# Patient Record
Sex: Male | Born: 1955 | Race: Asian | Hispanic: No | Marital: Married | Smoking: Never smoker
Health system: Southern US, Community
[De-identification: ages and names within clinical notes are randomized; demographics above are authoritative.]

## PROBLEM LIST (undated history)

## (undated) DIAGNOSIS — F411 Generalized anxiety disorder: Secondary | ICD-10-CM

## (undated) DIAGNOSIS — I10 Essential (primary) hypertension: Secondary | ICD-10-CM

## (undated) DIAGNOSIS — I440 Atrioventricular block, first degree: Secondary | ICD-10-CM

## (undated) DIAGNOSIS — E119 Type 2 diabetes mellitus without complications: Secondary | ICD-10-CM

## (undated) DIAGNOSIS — E7800 Pure hypercholesterolemia, unspecified: Secondary | ICD-10-CM

## (undated) DIAGNOSIS — M25559 Pain in unspecified hip: Secondary | ICD-10-CM

## (undated) DIAGNOSIS — N529 Male erectile dysfunction, unspecified: Secondary | ICD-10-CM

## (undated) DIAGNOSIS — E785 Hyperlipidemia, unspecified: Secondary | ICD-10-CM

## (undated) DIAGNOSIS — M1990 Unspecified osteoarthritis, unspecified site: Secondary | ICD-10-CM

## (undated) DIAGNOSIS — L2089 Other atopic dermatitis: Secondary | ICD-10-CM

## (undated) DIAGNOSIS — K219 Gastro-esophageal reflux disease without esophagitis: Secondary | ICD-10-CM

## (undated) HISTORY — PX: PARTIAL HIP ARTHROPLASTY: SHX733

## (undated) HISTORY — DX: Hyperlipidemia, unspecified: E78.5

## (undated) HISTORY — DX: Atrioventricular block, first degree: I44.0

## (undated) HISTORY — DX: Unspecified osteoarthritis, unspecified site: M19.90

## (undated) HISTORY — DX: Type 2 diabetes mellitus without complications: E11.9

## (undated) HISTORY — DX: Other atopic dermatitis: L20.89

## (undated) HISTORY — DX: Generalized anxiety disorder: F41.1

## (undated) HISTORY — DX: Essential (primary) hypertension: I10

## (undated) HISTORY — DX: Male erectile dysfunction, unspecified: N52.9

## (undated) HISTORY — DX: Pain in unspecified hip: M25.559

## (undated) HISTORY — DX: Gastro-esophageal reflux disease without esophagitis: K21.9

## (undated) HISTORY — DX: Pure hypercholesterolemia, unspecified: E78.00

---

## 2004-05-21 ENCOUNTER — Encounter: Admission: RE | Admit: 2004-05-21 | Discharge: 2004-05-21 | Payer: Self-pay | Admitting: Orthopaedic Surgery

## 2007-09-28 ENCOUNTER — Inpatient Hospital Stay (HOSPITAL_COMMUNITY): Admission: RE | Admit: 2007-09-28 | Discharge: 2007-10-02 | Payer: Self-pay | Admitting: Orthopedic Surgery

## 2009-09-21 IMAGING — CR DG PORTABLE PELVIS
1 series · 1 of 1 positions shown · non-contrast
Comparison: 09/21/2007.

CLINICAL DATA: Status post right hip replacement for
osteoarthritis.

PORTABLE PELVIS

[view not recorded]
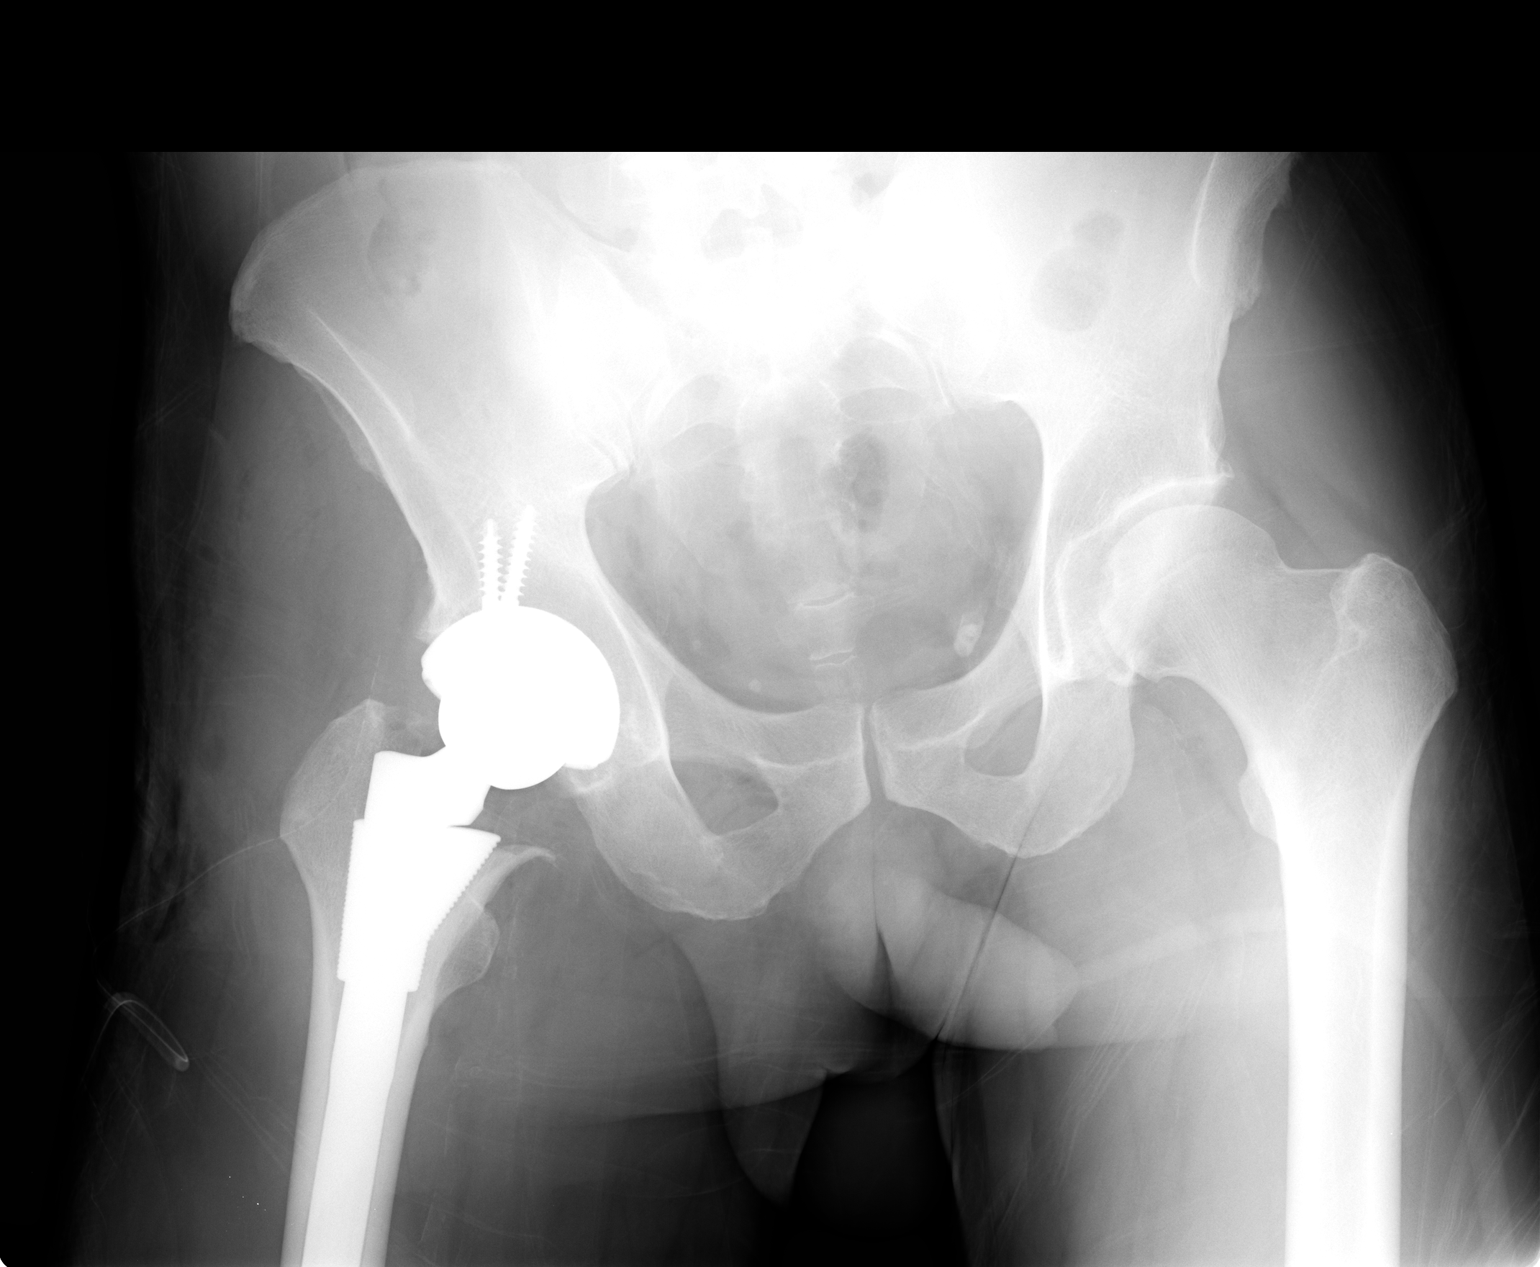

[1 of 1 positions shown; findings below may reference images not displayed]

FINDINGS: Interval right total hip prosthesis in satisfactory
position and alignment.  The distal portion of the femoral
component is not included and is evaluated on a separate hip
radiograph.  No fracture or dislocation seen.
IMPRESSION: Satisfactory postoperative appearance of a right total hip
prosthesis.

## 2009-09-24 IMAGING — CR DG CHEST 2V
2 series · 2 of 2 positions shown · non-contrast
Comparison: PA and lateral chest radiograph 09/21/2007

CLINICAL DATA: Osteoarthritis right hip.   Evaluate for atelectasis
or effusions.

CHEST - 2 VIEW

[w chest pa]
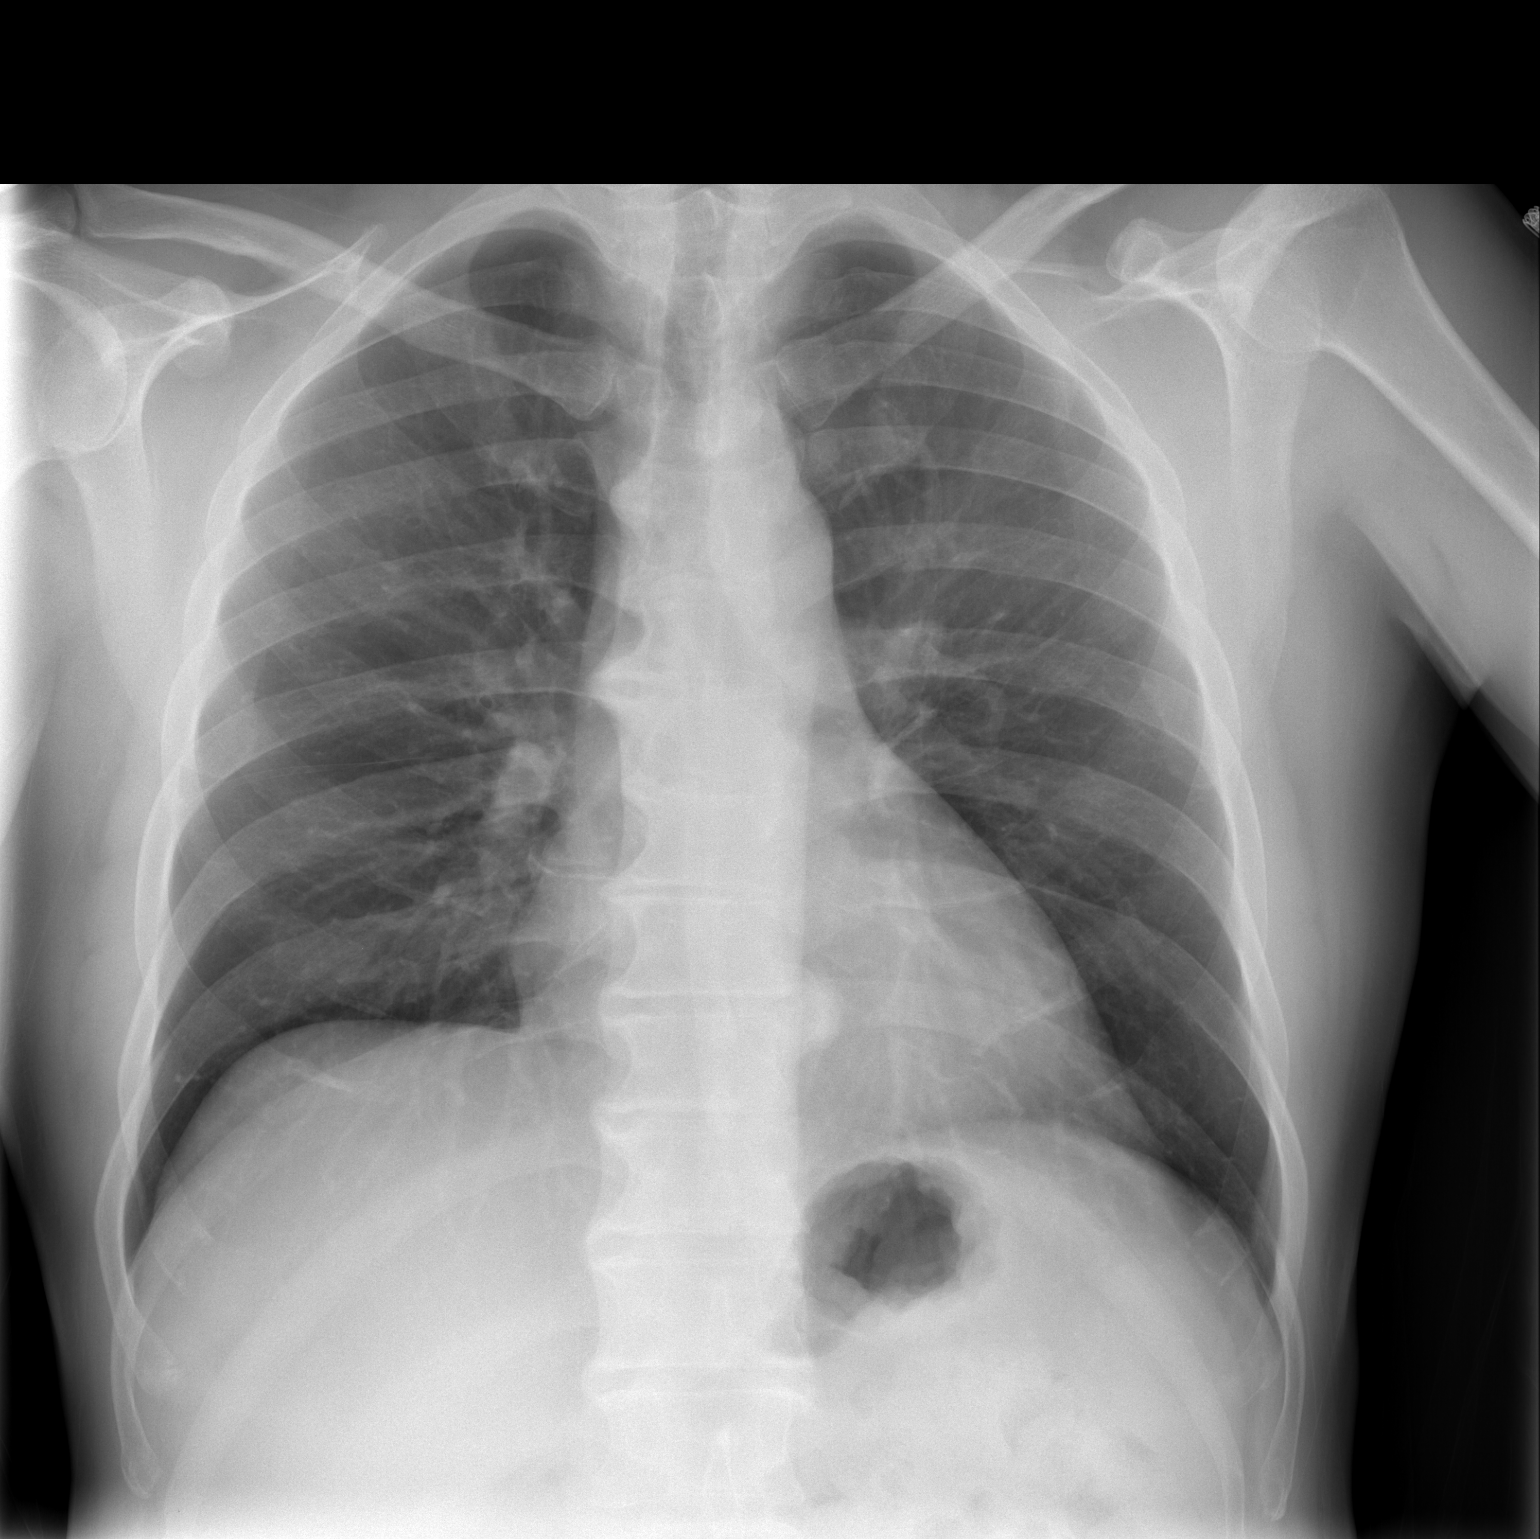

[w chest lat]
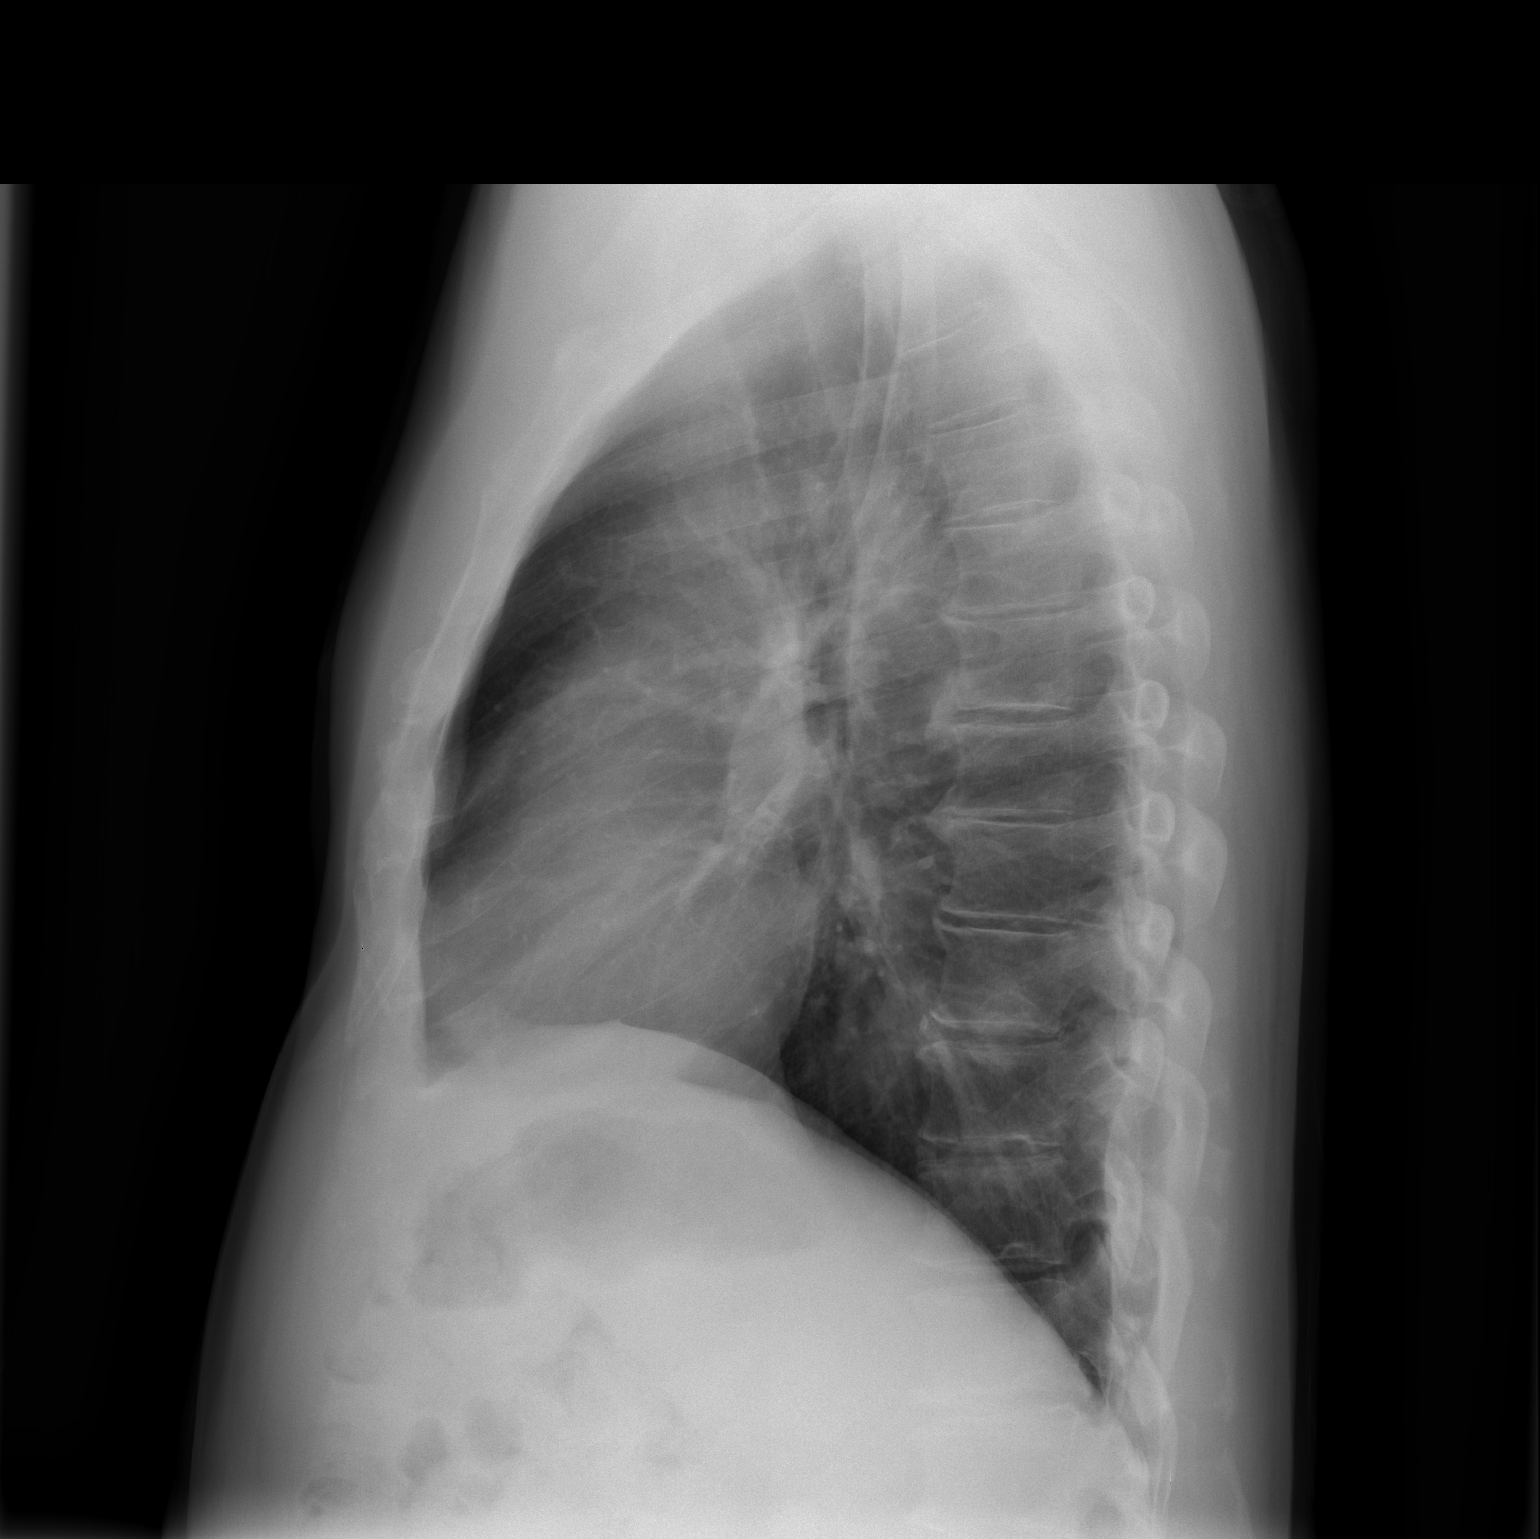

[2 of 2 positions shown; findings below may reference images not displayed]

FINDINGS: Heart and mediastinal contours are stable.  The lungs are
clear.  There is no pleural effusion.  Thoracic spine degenerative
changes are noted.
IMPRESSION: No evidence of acute cardiopulmonary disease.

## 2010-07-24 NOTE — Op Note (Signed)
[name], Livingston NO.:  [id number]   MEDICAL RECORD NO.:  [id number]          PATIENT TYPE:  INP   LOCATION:  0009                         FACILITY:  [facility]   PHYSICIAN:  Ollen Gross, M.D.    DATE OF BIRTH:  [birth year]   DATE OF PROCEDURE:  09/28/2007  DATE OF DISCHARGE:                               OPERATIVE REPORT   PREOPERATIVE DIAGNOSIS:  Osteoarthritis right hip.   POSTOPERATIVE DIAGNOSIS:  Osteoarthritis right hip.   PROCEDURE:  Right total hip arthroplasty.   SURGEON:  Ollen Gross, M.D.   ASSISTANT:  Alexzandrew L. Perkins, P.A.C.   ANESTHESIA:  General.   ESTIMATED BLOOD LOSS:  500.   DRAIN:  Hemovac x1.   COMPLICATIONS:  None.   CONDITION:  Stable to recovery.   BRIEF CLINICAL NOTE:  Greg Livingston is a 55 year old male with end-stage  arthritis of the right hip with progressively worsening pain and  dysfunction.  He has failed nonoperative management and presents now for  total hip arthroplasty.   PROCEDURE IN DETAIL:  After the successful administration of general  anesthetic, the patient was placed in left lateral decubitus position  with the right side up and held with the hip positioner.  The right  lower extremity was isolated from its perineum with plastic drapes and  prepped and draped in the usual sterile fashion.  A short posterolateral  incision was made with a 10 blade through the subcutaneous tissue to the  level of fascia lata which was incised in line with the skin incision.  The sciatic nerve was palpated and protected, and the short external  rotators isolated off the femur.  Capsulectomy was performed and the hip  was dislocated.  The center of the femoral head was marked and a trial  prosthesis placed such that the center of the trial head corresponded to  the center of his native femoral head.  Osteotomy line was marked at the  femoral neck and osteotomy made with an oscillating saw.  The femoral  head then  removed with the femur retracted anteriorly to gain acetabular  exposure.   Acetabular retractors were placed and the labrum and osteophytes  removed.  Acetabular reaming starts at 47 mm increasing in increments of  2mm to 53 mm and then a 54 mm pinnacle acetabular shell was placed in an  anatomic position and transfixed with two dome screws.  The apex hole  eliminator was placed and the 36 mm neutral Ultramet metal pole liner  was placed for a metal-on-metal hip replacement.   The femur is prepared with the canal finder and irrigation.  Axial  reaming was performed to 15.5 mm proximally with a20D and machined to  large.  A 20B large trial sleeve was placed with a 25-15 stem and a 36  standard neck, matching native anteversion.  A 36+0 head was place.  The  hip was reduced and stable to 70 degrees flexion,40 degrees abduction,  90 degrees internal rotation, 90 degrees of flexion, and 70 degrees of  internal rotation.  The hip was then dislocated and the trials removed.  The permanent 20D large  sleeve was placed with a 25-15 stem,  36  standard neck, matching native anteversion.  A 36+0 head was placed and  had good tension and the same stability parameters.  The wound was  copiously irrigated with saline solution and the short rotators were  reattached to the femur through drill holes.  The fascia lata was closed  over a Hemovac drain with interrupted #1 Vicryl.  The subcu closed with  a 1-0 and 2-0 Vicryl and subcuticular running 4-0 Monocryl.  The  incision was cleaned and dried and Steri-Strips and a bulky sterile  dressing applied.  A drain was hooked to suction and he was placed into  a knee immobilizer, awakened and transported to recovery in stable  condition.      Ollen Gross, M.D.  Electronically Signed     FA/MEDQ  D:  09/28/2007  T:  09/28/2007  Job:  [id number]

## 2010-07-27 NOTE — H&P (Signed)
Greg Livingston, BRAME NO.:  [id number]   MEDICAL RECORD NO.:  [id number]          PATIENT TYPE:  INP   LOCATION:  1602                         FACILITY:  [facility]   PHYSICIAN:  Ollen Gross, M.D.    DATE OF BIRTH:  1955/08/25   DATE OF ADMISSION:  09/28/2007  DATE OF DISCHARGE:  10/02/2007                              HISTORY & PHYSICAL   CHIEF COMPLAINT:  Right hip pain.   HISTORY OF PRESENT ILLNESS:  Patient is a 55 year old male who has been  seen by Dr. Lequita Halt for ongoing hip pain.  It has been ongoing for quite  some time now.  He has been treated conservatively in the past but has  increasing pain despite conservative measures.  X-rays show  significantly concentric joint space narrowing with right hip and  osteophyte formation.  It is felt he has reached the point where he  could benefit from undergoing surgical intervention.  Risks and benefits  discussed.  The patient is subsequently admitted to the hospital.   ALLERGIES:  None.   CURRENT MEDICATIONS:  Lisinopril, metformin.   PAST MEDICAL HISTORY:  1. Hypertension.  2. Diabetes.   PAST SURGERY HISTORY:  None.   FAMILY HISTORY:  Father with cancer.  Mother with diabetes.   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  Alcohol, about 3  ounces a day.  One child.   REVIEW OF SYSTEMS:  GENERAL:  No fevers, chills, night sweats.  Seizures, syncope or paralysis.  Respiratory: No shortness of breath,  productive cough or hemoptysis.  CARDIOVASCULAR:  Chest pain, angina or  orthopnea.  GI:  No nausea, vomiting, diarrhea or constipation.  GU: No  dysuria, hematuria or discharge.  MUSCULOSKELETAL: Right hip.   PHYSICAL EXAMINATION:  VITAL SIGNS:  Pulse 64, respirations 12, blood  pressure 152/72.  GENERAL:  A 51-year white male, thin frame, no acute distress, alert,  oriented and cooperative.  HEENT: Normocephalic, atraumatic.  Pupils round and reactive.  Oropharynx clear.  EOMs intact.  NECK:   Supple.  CHEST:  Clear.  HEART:  Regular rate and rhythm.  No murmur, S1-S2 noted.  ABDOMEN:  Soft, nontender.  Bowel sounds present.  RECTAL, BREASTS, GENITALIA:  Not done, not pertinent to present illness.  EXTREMITIES:  Right hip flexion 100, 5 degrees internal rotation, 15  degrees external rotation, about 25 degrees abduction, slight antalgic  gait.   IMPRESSION:  Osteoarthritis right hip.   PLAN:  The patient admitted to [facility] to undergo a right  total hip.  Surgery will be performed by Ollen Gross.      Alexzandrew L. Perkins, P.A.C.      Ollen Gross, M.D.  Electronically Signed    ALP/MEDQ  D:  10/04/2007  T:  10/04/2007  Job:  [id number]   cc:   Dr. Tiburcio Pea

## 2010-07-27 NOTE — Discharge Summary (Signed)
NAMEDEVIAN, BARTOLOMEI NO.:  [id number]   MEDICAL RECORD NO.:  [id number]          PATIENT TYPE:  INP   LOCATION:  1602                         FACILITY:  [facility]   PHYSICIAN:  Ollen Gross, M.D.    DATE OF BIRTH:  1955/06/12   DATE OF ADMISSION:  09/28/2007  DATE OF DISCHARGE:  10/02/2007                               DISCHARGE SUMMARY   ADMITTING DIAGNOSES:  1. Osteoarthritis right hip.  2. Hypertension.  3. Diabetes.   DISCHARGE DIAGNOSES:  1. Osteoarthritis right hip status post right total replacement      arthroplasty.  2. Mild postop blood loss anemia.  3. Mild postop hyponatremia.  4. Hypertension.  5. Diabetes.   PROCEDURE:  September 28, 2007 right total hip.  Surgeon, Dr. Lequita Halt.  Assistant, Avel Peace, PA-C.   ANESTHESIA:  General.   CONSULTS:  None.   BRIEF HISTORY:  Mr. Greg Livingston is a 55 year old male with end-stage  osteoarthritis right hip, progressive worsening pain dysfunction, failed  nonoperative management.  Now presents for total hip arthroplasty.   LABORATORY DATA:  Preop CBC, hemoglobin 14.1, hematocrit of 44.8, white  cell count 5.5, red cell count 7.06, platelets 174,000.  Postop  hemoglobin 11 dropped down to 10.6 last night, H&H 9.9 and 31.4.  PT/PTT  preop 13.4 and 27 respectively with an INR 1.  Serial pro time followed.  PT/INR 21.9 and 1.8.  Chem panel on admission all within normal limits  with exception of minimally elevated total bili of 1.5 and elevated  glucose 286.  Known diabetic.  Serial BMET followed.  Sodium did drop  from 139 to 133 back up to 137, glucose came down to 208.  Preop UA  positive glucose otherwise negative.  Followup UA positive glucose  otherwise negative.  Blood group type A positive.  Urine culture did  prove to be positive for enterobacter, sensitivities on chart.  He did  receive IV antibiotics postoperatively.   EKG September 21, 2007 sinus rhythm, first degree AV block, incomplete right  bundle  branch block, borderline EKG confirmed by Dr. Armanda Magic.  Hip  films September 21, 2007 advanced degenerate changes involving the right hip.  Chest films September 21, 2007 no acute findings.  Followup chest on October 01, 2007.  No evidence acute findings.   HOSPITAL COURSE:  The patient admitted to [facility] tolerated  procedure well, later transferred recovery and then orthopedic floor  started on PCA and p.o. analgesics doing pretty well on the morning of  day one, got a little dizzy that evening.  Early morning he started back  on his blood pressure meds.  He was on lisinopril which was on hold  initially.  His blood pressures running a little on the lower side but  he was asymptomatic with that.  Normally took metformin, which was on  hold per hospital protocol.  He was on sliding scale for his diabetes.  Started get up with therapy.  By day two he was up walking short  distances, was progressing with this therapy.  Hemoglobin was 10.6.  Dressing changed on day 2 incision was  healing well, felt to be ready to  go home when he was independent by day three was doing pretty well,  elevated temperature of 101.9.  We checked his chest x-ray and UA.  Had  a normal white count.  Chest x-ray revealed positive.  Urinalysis was  negative.  He was walking about 170 feet by day four.  He was doing much  better.  Chest x-ray was negative.  His UA was negative.  Temperature  was back down.  No problems.  No complaints.  Doing well with this  therapy and discharged home.   DISCHARGE/PLAN:  1. The patient was discharged home on October 02, 2007.  2. Discharge diagnoses, please see above.  3. Discharge meds, OxyIR, Robaxin, Coumadin, Nu-Iron.  4. Followup 2 weeks.  5. Activity, partial weightbearing 25-50% right lower extremity.  6. Diet, diabetic diet.   DISPOSITION:  Home.   CONDITION ON DISCHARGE:  Improved.      Alexzandrew L. Perkins, P.A.C.      Ollen Gross, M.D.   Electronically Signed    ALP/MEDQ  D:  11/17/2007  T:  11/17/2007  Job:  [id number]

## 2010-12-06 LAB — CBC
Hemoglobin: 14.1
Platelets: 174
RDW: 16.1 — ABNORMAL HIGH

## 2010-12-06 LAB — URINALYSIS, ROUTINE W REFLEX MICROSCOPIC
Glucose, UA: >1000 — AB
Hgb urine dipstick: NEGATIVE
Leukocytes, UA: NEGATIVE
Specific Gravity, Urine: 1.026
Urobilinogen, UA: 1
pH: 6.5

## 2010-12-06 LAB — COMPREHENSIVE METABOLIC PANEL
Albumin: 3.9
CO2: 30
Calcium: 9.6
Chloride: 102
GFR calc Af Amer: >60
Glucose, Bld: 286 — ABNORMAL HIGH
Potassium: 3.8
Sodium: 139
Total Bilirubin: 1.5 — ABNORMAL HIGH

## 2010-12-06 LAB — PROTIME-INR: Prothrombin Time: 13.4

## 2010-12-07 LAB — CBC
HCT: 31.4 — ABNORMAL LOW
HCT: 32.6 — ABNORMAL LOW
HCT: 34.9 — ABNORMAL LOW
Hemoglobin: 10.6 — ABNORMAL LOW
MCHC: 31.6
MCV: 62.9 — ABNORMAL LOW
MCV: 63.2 — ABNORMAL LOW
MCV: 63.6 — ABNORMAL LOW
Platelets: 124 — ABNORMAL LOW
Platelets: 134 — ABNORMAL LOW
Platelets: 159
RBC: 5.13
WBC: 7.8
WBC: 8.5

## 2010-12-07 LAB — PROTIME-INR
INR: 1.1
INR: 1.8 — ABNORMAL HIGH
Prothrombin Time: 21.9 — ABNORMAL HIGH
Prothrombin Time: 22.1 — ABNORMAL HIGH

## 2010-12-07 LAB — GLUCOSE, CAPILLARY
Glucose-Capillary: 178 — ABNORMAL HIGH
Glucose-Capillary: 182 — ABNORMAL HIGH
Glucose-Capillary: 192 — ABNORMAL HIGH
Glucose-Capillary: 207 — ABNORMAL HIGH
Glucose-Capillary: 224 — ABNORMAL HIGH
Glucose-Capillary: 264 — ABNORMAL HIGH
Glucose-Capillary: 281 — ABNORMAL HIGH
Glucose-Capillary: 294 — ABNORMAL HIGH

## 2010-12-07 LAB — TYPE AND SCREEN
ABO/RH(D): A POS
Antibody Screen: NEGATIVE

## 2010-12-07 LAB — BASIC METABOLIC PANEL
BUN: 6
BUN: 8
CO2: 29
Chloride: 101
Chloride: 96
GFR calc Af Amer: >60
GFR calc non Af Amer: >60
GFR calc non Af Amer: >60
Potassium: 3.6
Sodium: 137

## 2010-12-07 LAB — URINALYSIS, ROUTINE W REFLEX MICROSCOPIC
Bilirubin Urine: NEGATIVE
Hgb urine dipstick: NEGATIVE
Nitrite: NEGATIVE
Protein, ur: NEGATIVE
Urobilinogen, UA: 1

## 2010-12-07 LAB — URINE CULTURE: Special Requests: NEGATIVE

## 2010-12-07 LAB — ABO/RH: ABO/RH(D): A POS

## 2013-09-14 ENCOUNTER — Encounter: Payer: Self-pay | Admitting: *Deleted

## 2013-09-15 ENCOUNTER — Ambulatory Visit (INDEPENDENT_AMBULATORY_CARE_PROVIDER_SITE_OTHER): Payer: BC Managed Care – PPO | Admitting: Cardiovascular Disease

## 2013-09-15 ENCOUNTER — Encounter (INDEPENDENT_AMBULATORY_CARE_PROVIDER_SITE_OTHER): Payer: Self-pay

## 2013-09-15 ENCOUNTER — Encounter: Payer: Self-pay | Admitting: Cardiovascular Disease

## 2013-09-15 VITALS — BP 168/87 | HR 74 | Ht 65.0 in | Wt 178.5 lb

## 2013-09-15 DIAGNOSIS — E119 Type 2 diabetes mellitus without complications: Secondary | ICD-10-CM

## 2013-09-15 DIAGNOSIS — R42 Dizziness and giddiness: Secondary | ICD-10-CM

## 2013-09-15 DIAGNOSIS — I10 Essential (primary) hypertension: Secondary | ICD-10-CM

## 2013-09-15 DIAGNOSIS — I119 Hypertensive heart disease without heart failure: Secondary | ICD-10-CM

## 2013-09-15 MED ORDER — POTASSIUM CHLORIDE ER 10 MEQ PO TBCR: 10.0000 meq | EXTENDED_RELEASE_TABLET | Freq: Every day | ORAL | Status: DC

## 2013-09-15 MED ORDER — HYDROCHLOROTHIAZIDE 25 MG PO TABS: 25.0000 mg | ORAL_TABLET | Freq: Every day | ORAL | Status: DC

## 2013-09-15 NOTE — Progress Notes (Signed)
Greg Livingston Date of Birth  04/06/1955       [facility]    [place] [facility] [address], [address]  [address], [address] [place]  [address]   [place], [place]  [address] [phone]     [phone]   Fax  [phone]     Fax [phone]  Problem List: 1. HTN 2. diabetes mellitus 3. Left ventricular hypertrophy 4. Diastolic dysfunction-by an echocardiogram [place] in [place].  History of Present Illness:  Greg Livingston  Is a 58 yo from [place].  Presents for further evaluation of HTN.   He has had a variable BP .  He was this increase several weeks ago and his blood pressure was as high as 210/110. He has been keeping a BP log.   His mother has history of diabetes and high blood pressure.  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   Current Outpatient Prescriptions on File Prior to Visit  Medication Sig Dispense Refill  . glipiZIDE-metformin (METAGLIP) 2.5-500 MG per tablet Take 2 tablets by mouth 2 (two) times daily before a meal.      . lisinopril (PRINIVIL,ZESTRIL) 40 MG tablet Take 40 mg by mouth daily.      . mometasone (ELOCON) 0.1 % cream Apply 1 application topically 2 (two) times daily as needed.      . nebivolol (BYSTOLIC) 5 MG tablet Take 5 mg by mouth daily.       No current facility-administered medications on file prior to visit.    No Known Allergies  Past Medical History  Diagnosis Date  . Diabetes mellitus without complication   . Pure hypercholesterolemia   . Hypertension   . Pain in joint, pelvic region and thigh   . Osteoarthrosis, unspecified whether generalized or localized, other specified sites   . Other atopic dermatitis and related conditions   . Esophageal reflux   . Anxiety state, unspecified   . Impotence of organic origin   . Other and unspecified hyperlipidemia   . First degree AV block     Past Surgical History  Procedure  Laterality Date  . Partial hip arthroplasty      History  Smoking status  . Never Smoker   Smokeless tobacco  . Not on file    History  Alcohol Use No    Family History  Problem Relation Age of Onset  . Diabetes Mother     Reviw of Systems:  Reviewed in the HPI.  All other systems are negative.  Physical Exam: Blood pressure 168/87, pulse 74, height 5\' 5"  (1.651 m), weight 178 lb 8 oz (80.967 kg). Wt Readings from Last 3 Encounters:  09/15/13 178 lb 8 oz (80.967 kg)     General: Well developed, well nourished, in no acute distress.  Head: Normocephalic, atraumatic, sclera non-icteric, mucus membranes are moist,   Neck: Supple. Carotids are 2 + without bruits. No JVD   Lungs: Clear   Heart: RR, normal S1S2  Abdomen: Soft, non-tender, non-distended with normal bowel sounds.  Msk:  Strength and tone are normal   Extremities: No clubbing or cyanosis. No edema.  Distal pedal pulses are 2+ and equal    Neuro: CN II - XII intact.  Alert and oriented X 3.   Psych:  Normal   ECG: September 15, 2013:  NSR at 2074.  Normal   Assessment / Plan:

## 2013-09-15 NOTE — Patient Instructions (Signed)
Your physician has recommended you make the following change in your medication:  Start HCTZ 25 mg once daily  Start Kdur 10 meq once daily   Your physician recommends that you schedule a follow-up appointment in: Dr. Elease HashimotoNahser in 1 month

## 2013-09-15 NOTE — Assessment & Plan Note (Addendum)
Has a history of hypertension and now has developed left ventricular hypertrophy as demonstrated by his echo while in [place]. He does not have any CHF  symptoms but does have some diastolic dysfunction demonstrated on echo.  He's been under lots of stress with all of the turmoil increase recently.  We will add HCTZ 25 mg a day and potassium chloride 10 mEq a day to  his medicines. I'll see him again in one month for followup visit.  We'll check a BMP at that time  He may need change his ACE inhibitor to an ARB in may also need to add amlodipine.

## 2013-10-05 ENCOUNTER — Encounter: Payer: Self-pay | Admitting: Cardiovascular Disease

## 2013-10-05 ENCOUNTER — Ambulatory Visit (INDEPENDENT_AMBULATORY_CARE_PROVIDER_SITE_OTHER): Payer: BC Managed Care – PPO | Admitting: Cardiovascular Disease

## 2013-10-05 VITALS — BP 118/74 | HR 75 | Ht 66.0 in | Wt 177.0 lb

## 2013-10-05 DIAGNOSIS — I10 Essential (primary) hypertension: Secondary | ICD-10-CM

## 2013-10-05 NOTE — Patient Instructions (Addendum)
Go to the grocery store and buy a salt substitute.  Typical brand names are "No-salt"  Or Nu-salt"    Please call us if you have new issues that need to be addressed before your next appt.  Your physician wants you to follow-up in: 6 months.  You will receive a reminder letter in the mail two months in advance. If you don't receive a letter, please call our office to schedule the follow-up appointment.

## 2013-10-05 NOTE — Progress Notes (Signed)
Greg DupesNikolas I Livingston Date of Birth  22-May-1955       [facility]    [place] [facility] [address]  [address] [place], [place]  [address]   [place], [place]  [address] [phone]     [phone]   Fax  [phone]     Fax [phone]  Problem List: 1. HTN 2. diabetes mellitus 3. Left ventricular hypertrophy 4. Diastolic dysfunction-by an echocardiogram performed in [place].  History of Present Illness:  Greg Livingston  Is a 58 yo from [place].  Presents for further evaluation of HTN.   He has had a variable BP .  He was this increase several weeks ago and his blood pressure was as high as 210/110. He has been keeping a BP log.   His mother has history of diabetes and high blood pressure.  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   October 05, 2013: Greg Livingston is doing well.  BP is better.  We started him in HCTZ at his last visit. He     Current Outpatient Prescriptions on File Prior to Visit  Medication Sig Dispense Refill  . aspirin 81 MG tablet Take 81 mg by mouth daily.      Marland Kitchen. glipiZIDE-metformin (METAGLIP) 2.5-500 MG per tablet Take 2 tablets by mouth 2 (two) times daily before a meal.      . hydrochlorothiazide (HYDRODIURIL) 25 MG tablet Take 1 tablet (25 mg total) by mouth daily.  90 tablet  3  . lisinopril (PRINIVIL,ZESTRIL) 40 MG tablet Take 40 mg by mouth daily.      . mometasone (ELOCON) 0.1 % cream Apply 1 application topically 2 (two) times daily as needed.      . nebivolol (BYSTOLIC) 5 MG tablet Take 5 mg by mouth daily.      . potassium chloride (K-DUR) 10 MEQ tablet Take 1 tablet (10 mEq total) by mouth daily.  90 tablet  3   No current facility-administered medications on file prior to visit.    No Known Allergies  Past Medical History  Diagnosis Date  . Diabetes mellitus without complication   . Pure hypercholesterolemia   . Hypertension   .  Pain in joint, pelvic region and thigh   . Osteoarthrosis, unspecified whether generalized or localized, other specified sites   . Other atopic dermatitis and related conditions   . Esophageal reflux   . Anxiety state, unspecified   . Impotence of organic origin   . Other and unspecified hyperlipidemia   . First degree AV block     Past Surgical History  Procedure Laterality Date  . Partial hip arthroplasty      History  Smoking status  . Never Smoker   Smokeless tobacco  . Not on file    History  Alcohol Use No    Family History  Problem Relation Age of Onset  . Diabetes Mother     Reviw of Systems:  Reviewed in the HPI.  All other systems are negative.  Physical Exam: Blood pressure 118/74, pulse 75, height 5\' 6"  (1.676 m), weight 177 lb (80.287 kg). Wt Readings from Last 3 Encounters:  10/05/13 177 lb (80.287 kg)  09/15/13 178 lb 8 oz (80.967 kg)     General: Well developed, well nourished, in no acute distress.  Head: Normocephalic, atraumatic, sclera non-icteric, mucus membranes are moist,   Neck: Supple. Carotids are  2 + without bruits. No JVD   Lungs: Clear   Heart: RR, normal S1S2  Abdomen: Soft, non-tender, non-distended with normal bowel sounds.  Msk:  Strength and tone are normal   Extremities: No clubbing or cyanosis. No edema.  Distal pedal pulses are 2+ and equal    Neuro: CN II - XII intact.  Alert and oriented X 3.   Psych:  Normal   ECG: September 15, 2013:  NSR at 57.  Normal   Assessment / Plan:

## 2014-05-16 ENCOUNTER — Ambulatory Visit: Payer: Self-pay | Admitting: Cardiovascular Disease

## 2014-05-16 ENCOUNTER — Ambulatory Visit (INDEPENDENT_AMBULATORY_CARE_PROVIDER_SITE_OTHER): Payer: BLUE CROSS/BLUE SHIELD | Admitting: Cardiovascular Disease

## 2014-05-16 ENCOUNTER — Encounter: Payer: Self-pay | Admitting: Cardiovascular Disease

## 2014-05-16 VITALS — BP 124/54 | HR 74 | Ht 66.0 in | Wt 179.8 lb

## 2014-05-16 DIAGNOSIS — I509 Heart failure, unspecified: Secondary | ICD-10-CM

## 2014-05-16 DIAGNOSIS — I110 Hypertensive heart disease with heart failure: Secondary | ICD-10-CM

## 2014-05-16 MED ORDER — NEBIVOLOL HCL 5 MG PO TABS: 5.0000 mg | ORAL_TABLET | Freq: Every day | ORAL | Status: DC

## 2014-05-16 MED ORDER — HYDROCHLOROTHIAZIDE 25 MG PO TABS: 25.0000 mg | ORAL_TABLET | Freq: Every day | ORAL | Status: DC

## 2014-05-16 MED ORDER — POTASSIUM CHLORIDE ER 10 MEQ PO TBCR: 10.0000 meq | EXTENDED_RELEASE_TABLET | Freq: Every day | ORAL | Status: DC

## 2014-05-16 MED ORDER — LISINOPRIL 40 MG PO TABS: 40.0000 mg | ORAL_TABLET | Freq: Every day | ORAL | Status: DC

## 2014-05-16 NOTE — Patient Instructions (Signed)
Your physician recommends that you continue on your current medications as directed. Please refer to the Current Medication list given to you today.  Your physician wants you to follow-up in: 1 year. You will receive a reminder letter in the mail two months in advance. If you don't receive a letter, please call our office to schedule the follow-up appointment.  

## 2014-05-16 NOTE — Progress Notes (Signed)
Cardiology Office Note   Date:  05/16/2014   ID:  Greg Livingston, DOB 04/27/1955, MRN [id number]  PCP:  Johny Blamer, MD  Cardiologist:   Vesta Mixer, MD   Chief Complaint  Patient presents with  . Hypertension   Problem List: 1. HTN 2. diabetes mellitus 3. Left ventricular hypertrophy 4. Diastolic dysfunction-by an echocardiogram performed in [place], [place].  History of Present Illness:  Greg Livingston Is a 59 yo from [place]. Presents for further evaluation of HTN.  He has had a variable BP . He was this increase several weeks ago and his blood pressure was as high as 210/110. He has been keeping a BP log.   His mother has history of diabetes and high blood pressure. 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   October 05, 2013: Greg Livingston is doing well. BP is better. We started him in HCTZ at his last visit.   May 16, 2014:  Greg Livingston is a 59 y.o. male who presents for follow-up of his hypertension. His blood pressure has been well controlled. He still exercises on a regular basis.  He is feeling great.    Past Medical History  Diagnosis Date  . Diabetes mellitus without complication   . Pure hypercholesterolemia   . Hypertension   . Pain in joint, pelvic region and thigh   . Osteoarthrosis, unspecified whether generalized or localized, other specified sites   . Other atopic dermatitis and related conditions   . Esophageal reflux   . Anxiety state, unspecified   . Impotence of organic origin   . Other and unspecified hyperlipidemia   . First degree AV block     Past Surgical History  Procedure Laterality Date  . Partial hip arthroplasty       Current Outpatient Prescriptions  Medication Sig Dispense Refill  . ALPRAZolam (XANAX) 0.5 MG tablet Take 0.5 mg by mouth daily as needed.  5  . aspirin 81 MG tablet Take 81 mg by mouth daily.    Marland Kitchen glipiZIDE-metformin (METAGLIP)  2.5-500 MG per tablet Take 2 tablets by mouth 2 (two) times daily before a meal.    . hydrochlorothiazide (HYDRODIURIL) 25 MG tablet Take 1 tablet (25 mg total) by mouth daily. 90 tablet 3  . lisinopril (PRINIVIL,ZESTRIL) 40 MG tablet Take 40 mg by mouth daily.    . mometasone (ELOCON) 0.1 % cream Apply 1 application topically 2 (two) times daily as needed.    . nebivolol (BYSTOLIC) 5 MG tablet Take 5 mg by mouth daily.    . potassium chloride (K-DUR) 10 MEQ tablet Take 1 tablet (10 mEq total) by mouth daily. 90 tablet 3   No current facility-administered medications for this visit.    Allergies:   Review of patient's allergies indicates no known allergies.    Social History:  The patient  reports that he has never smoked. He does not have any smokeless tobacco history on file. He reports that he does not drink alcohol or use illicit drugs.   Family History:  The patient's family history includes Diabetes in his mother.    ROS:  Please see the history of present illness.    Review of Systems: Constitutional:  denies fever, chills, diaphoresis, appetite change and fatigue.  HEENT: denies photophobia, eye pain, redness, hearing loss, ear pain, congestion, sore throat, rhinorrhea, sneezing, neck pain, neck stiffness and tinnitus.  Respiratory: denies SOB, DOE, cough, chest tightness,  and wheezing.  Cardiovascular: denies chest pain, palpitations and leg swelling.  Gastrointestinal: denies nausea, vomiting, abdominal pain, diarrhea, constipation, blood in stool.  Genitourinary: denies dysuria, urgency, frequency, hematuria, flank pain and difficulty urinating.  Musculoskeletal: denies  myalgias, back pain, joint swelling, arthralgias and gait problem.   Skin: denies pallor, rash and wound.  Neurological: denies dizziness, seizures, syncope, weakness, light-headedness, numbness and headaches.   Hematological: denies adenopathy, easy bruising, personal or family bleeding history.    Psychiatric/ Behavioral: denies suicidal ideation, mood changes, confusion, nervousness, sleep disturbance and agitation.       All other systems are reviewed and negative.    PHYSICAL EXAM: VS:  BP 124/54 mmHg  Pulse 74  Ht 5\' 6"  (1.676 m)  Wt 179 lb 12.8 oz (81.557 kg)  BMI 29.03 kg/m2 , BMI Body mass index is 29.03 kg/(m^2). GEN: Well nourished, well developed, in no acute distress HEENT: normal Neck: no JVD, carotid bruits, or masses Cardiac: RRR; no murmurs, rubs, or gallops,no edema  Respiratory:  clear to auscultation bilaterally, normal work of breathing GI: soft, nontender, nondistended, + BS MS: no deformity or atrophy Skin: warm and dry, no rash Neuro:  Strength and sensation are intact Psych: normal   EKG:  EKG is not ordered today.    Recent Labs: No results found for requested labs within last 365 days.    Lipid Panel No results found for: CHOL, TRIG, HDL, CHOLHDL, VLDL, LDLCALC, LDLDIRECT    Wt Readings from Last 3 Encounters:  05/16/14 179 lb 12.8 oz (81.557 kg)  10/05/13 177 lb (80.287 kg)  09/15/13 178 lb 8 oz (80.967 kg)      Other studies Reviewed: Additional studies/ records that were reviewed today include: . Review of the above records demonstrates:    ASSESSMENT AND PLAN:  1. HTN - his blood pressure is very well-controlled. Continue current medications. We will refill his hypertensive medications. I'll see him again in one year.  2. diabetes mellitus - followed by his general medical doctor.  3. Left ventricular hypertrophy-  continue current medications.  4. Diastolic dysfunction-by an echocardiogram performed in [place].  Continue current medications.   Current medicines are reviewed at length with the patient today.  The patient does not have concerns regarding medicines.  The following changes have been made:  no change   Disposition:   FU with me in one year.    Signed, Nahser, Deloris PingPhilip J, MD  05/16/2014 10:55  AM    [facility] [address], [place], [place]  [address] Phone: [phone](336) [phone]; Fax: [phone]) [phone]

## 2015-05-15 ENCOUNTER — Ambulatory Visit (INDEPENDENT_AMBULATORY_CARE_PROVIDER_SITE_OTHER): Payer: BLUE CROSS/BLUE SHIELD | Admitting: Cardiovascular Disease

## 2015-05-15 ENCOUNTER — Encounter: Payer: Self-pay | Admitting: Cardiovascular Disease

## 2015-05-15 VITALS — BP 114/60 | HR 69 | Ht 66.0 in | Wt 182.8 lb

## 2015-05-15 DIAGNOSIS — I110 Hypertensive heart disease with heart failure: Secondary | ICD-10-CM | POA: Diagnosis not present

## 2015-05-15 DIAGNOSIS — I10 Essential (primary) hypertension: Secondary | ICD-10-CM | POA: Diagnosis not present

## 2015-05-15 MED ORDER — NEBIVOLOL HCL 5 MG PO TABS: 5.0000 mg | ORAL_TABLET | Freq: Every day | ORAL | Status: DC

## 2015-05-15 MED ORDER — LISINOPRIL 40 MG PO TABS: 40.0000 mg | ORAL_TABLET | Freq: Every day | ORAL | Status: DC

## 2015-05-15 MED ORDER — POTASSIUM CHLORIDE ER 10 MEQ PO TBCR: 10.0000 meq | EXTENDED_RELEASE_TABLET | Freq: Every day | ORAL | Status: DC

## 2015-05-15 MED ORDER — HYDROCHLOROTHIAZIDE 25 MG PO TABS: 25.0000 mg | ORAL_TABLET | Freq: Every day | ORAL | Status: DC

## 2015-05-15 NOTE — Progress Notes (Signed)
Cardiology Office Note   Date:  05/15/2015   ID:  Greg Livingston, DOB January 22, 1956, MRN [id number]  PCP:  Johny BlamerHARRIS, WILLIAM, MD  Cardiologist:   Vesta MixerNahser, Philip J, MD   Chief Complaint  Patient presents with  . Follow-up   Problem List: 1. HTN 2. diabetes mellitus 3. Left ventricular hypertrophy 4. Diastolic dysfunction-by an echocardiogram performed in [place], [place].  History of Present Illness:  Greg Livingston Is a 60 yo from [place]. Presents for further evaluation of HTN.  He has had a variable BP . He was this increase several weeks ago and his blood pressure was as high as 210/110. He has been keeping a BP log.   His mother has history of diabetes and high blood pressure. 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   October 05, 2013: Greg Livingston is doing well. BP is better. We started him in HCTZ at his last visit.   May 16, 2014:  Greg Livingston is a 60 y.o. male who presents for follow-up of his hypertension. His blood pressure has been well controlled. He still exercises on a regular basis.  He is feeling great.    May 15, 2015: Greg Livingston is seen back today for a 1 year visit.   Hx of HTN .   No CP or dyspnea.      Past Medical History  Diagnosis Date  . Diabetes mellitus without complication (HCC)   . Pure hypercholesterolemia   . Hypertension   . Pain in joint, pelvic region and thigh   . Osteoarthrosis, unspecified whether generalized or localized, other specified sites   . Other atopic dermatitis and related conditions   . Esophageal reflux   . Anxiety state, unspecified   . Impotence of organic origin   . Other and unspecified hyperlipidemia   . First degree AV block     Past Surgical History  Procedure Laterality Date  . Partial hip arthroplasty       Current Outpatient Prescriptions  Medication Sig Dispense Refill  . ALPRAZolam (XANAX) 0.5 MG tablet Take 0.5 mg by mouth daily  as needed.  5  . aspirin 81 MG tablet Take 81 mg by mouth daily.    Marland Kitchen. glipiZIDE-metformin (METAGLIP) 2.5-500 MG per tablet Take 2 tablets by mouth 2 (two) times daily before a meal.    . hydrochlorothiazide (HYDRODIURIL) 25 MG tablet Take 1 tablet (25 mg total) by mouth daily. 90 tablet 3  . lisinopril (PRINIVIL,ZESTRIL) 40 MG tablet Take 1 tablet (40 mg total) by mouth daily. 90 tablet 3  . mometasone (ELOCON) 0.1 % cream Apply 1 application topically 2 (two) times daily as needed.    . nebivolol (BYSTOLIC) 5 MG tablet Take 1 tablet (5 mg total) by mouth daily. 90 tablet 3  . potassium chloride (K-DUR) 10 MEQ tablet Take 1 tablet (10 mEq total) by mouth daily. 90 tablet 3   No current facility-administered medications for this visit.    Allergies:   Review of patient's allergies indicates no known allergies.    Social History:  The patient  reports that he has never smoked. He does not have any smokeless tobacco history on file. He reports that he does not drink alcohol or use illicit drugs.   Family History:  The patient's family history includes Diabetes in his mother.    ROS:  Please see the history of present illness.    Review of Systems: Constitutional:  denies fever, chills, diaphoresis, appetite change and fatigue.  HEENT: denies photophobia, eye pain, redness, hearing loss, ear pain, congestion, sore throat, rhinorrhea, sneezing, neck pain, neck stiffness and tinnitus.  Respiratory: denies SOB, DOE, cough, chest tightness, and wheezing.  Cardiovascular: denies chest pain, palpitations and leg swelling.  Gastrointestinal: denies nausea, vomiting, abdominal pain, diarrhea, constipation, blood in stool.  Genitourinary: denies dysuria, urgency, frequency, hematuria, flank pain and difficulty urinating.  Musculoskeletal: denies  myalgias, back pain, joint swelling, arthralgias and gait problem.   Skin: denies pallor, rash and wound.  Neurological: denies dizziness, seizures,  syncope, weakness, light-headedness, numbness and headaches.   Hematological: denies adenopathy, easy bruising, personal or family bleeding history.  Psychiatric/ Behavioral: denies suicidal ideation, mood changes, confusion, nervousness, sleep disturbance and agitation.      All other systems are reviewed and negative.   PHYSICAL EXAM: VS:  BP 114/60 mmHg  Pulse 69  Ht  (1.676 m)  Wt 182 lb 12.8 oz (82.918 kg)  BMI 29.52 kg/m2 , BMI Body mass index is 29.52 kg/(m^2). GEN: Well nourished, well developed, in no acute distress HEENT: normal Neck: no JVD, carotid bruits, or masses Cardiac: RRR; no murmurs, rubs, or gallops,no edema  Respiratory:  clear to auscultation bilaterally, normal work of breathing GI: soft, nontender, nondistended, + BS MS: no deformity or atrophy Skin: warm and dry, no rash Neuro:  Strength and sensation are intact Psych: normal   EKG:  EKG is ordered today. Personally reviewed - NSR at 69.   1st degree AV block .   LAD    Recent Labs: No results found for requested labs within last 365 days.    Lipid Panel No results found for: CHOL, TRIG, HDL, CHOLHDL, VLDL, LDLCALC, LDLDIRECT    Wt Readings from Last 3 Encounters:  05/15/15 182 lb 12.8 oz (82.918 kg)  05/16/14 179 lb 12.8 oz (81.557 kg)  10/05/13 177 lb (80.287 kg)      Other studies Reviewed: Additional studies/ records that were reviewed today include: . Review of the above records demonstrates:    ASSESSMENT AND PLAN:  1. HTN - his blood pressure is very well-controlled. Continue current medications. We will refill his hypertensive medications for 90 days . I'll see him again in one year.  2. diabetes mellitus - followed by his general medical doctor.  3. Left ventricular hypertrophy-  continue current medications.  4. Diastolic dysfunction-by an echocardiogram performed in [place], [place].  Continue current medications. BP has been well controlled.   Current medicines are  reviewed at length with the patient today.  The patient does not have concerns regarding medicines.  The following changes have been made:  no change   Disposition:   FU with me in one year.    Signed, Nahser, Deloris Ping, MD  05/15/2015 12:10 PM    [facility] [address] [place], [place], [place]  [address] Phone: [phone]; Fax: [phone]

## 2015-05-15 NOTE — Patient Instructions (Addendum)
Medication Instructions:  Your physician recommends that you continue on your current medications as directed. Please refer to the Current Medication list given to you today. 90 day refills have been sent on your cardiac medications  Labwork: None Ordered   Testing/Procedures: None Ordered   Follow-Up: Your physician wants you to follow-up in: 1 year with Dr. Elease HashimotoNahser.  You will receive a reminder letter in the mail two months in advance. If you don't receive a letter, please call our office to schedule the follow-up appointment.   If you need a refill on your cardiac medications before your next appointment, please call your pharmacy.   Thank you for choosing [facility]! Eligha BridegroomMichelle Swinyer, RN [phone]

## 2015-06-12 DIAGNOSIS — Z125 Encounter for screening for malignant neoplasm of prostate: Secondary | ICD-10-CM | POA: Diagnosis not present

## 2015-06-12 DIAGNOSIS — E7800 Pure hypercholesterolemia, unspecified: Secondary | ICD-10-CM | POA: Diagnosis not present

## 2015-06-12 DIAGNOSIS — I10 Essential (primary) hypertension: Secondary | ICD-10-CM | POA: Diagnosis not present

## 2015-06-12 DIAGNOSIS — E119 Type 2 diabetes mellitus without complications: Secondary | ICD-10-CM | POA: Diagnosis not present

## 2015-08-09 DIAGNOSIS — J069 Acute upper respiratory infection, unspecified: Secondary | ICD-10-CM | POA: Diagnosis not present

## 2015-10-28 DIAGNOSIS — W57XXXA Bitten or stung by nonvenomous insect and other nonvenomous arthropods, initial encounter: Secondary | ICD-10-CM | POA: Diagnosis not present

## 2015-10-28 DIAGNOSIS — S80862A Insect bite (nonvenomous), left lower leg, initial encounter: Secondary | ICD-10-CM | POA: Diagnosis not present

## 2015-11-07 DIAGNOSIS — J069 Acute upper respiratory infection, unspecified: Secondary | ICD-10-CM | POA: Diagnosis not present

## 2015-12-18 DIAGNOSIS — E7800 Pure hypercholesterolemia, unspecified: Secondary | ICD-10-CM | POA: Diagnosis not present

## 2015-12-18 DIAGNOSIS — I10 Essential (primary) hypertension: Secondary | ICD-10-CM | POA: Diagnosis not present

## 2015-12-18 DIAGNOSIS — J209 Acute bronchitis, unspecified: Secondary | ICD-10-CM | POA: Diagnosis not present

## 2015-12-18 DIAGNOSIS — E119 Type 2 diabetes mellitus without complications: Secondary | ICD-10-CM | POA: Diagnosis not present

## 2016-05-06 ENCOUNTER — Telehealth: Payer: Self-pay | Admitting: Cardiovascular Disease

## 2016-05-06 MED ORDER — NEBIVOLOL HCL 5 MG PO TABS: 5.0000 mg | ORAL_TABLET | Freq: Every day | ORAL | 0 refills | Status: DC

## 2016-05-06 MED ORDER — HYDROCHLOROTHIAZIDE 25 MG PO TABS: 25.0000 mg | ORAL_TABLET | Freq: Every day | ORAL | 0 refills | Status: DC

## 2016-05-06 NOTE — Telephone Encounter (Signed)
Spoke with patient's wife who called back to let me know that patient's Rx for HCTZ and bystolic have expired. I advised that I will send #15 pills so that he can resume these until he comes in for his appointment. Wife verbalized understanding and thanked me for the call.

## 2016-05-06 NOTE — Telephone Encounter (Signed)
Spoke with patient's wife who states patient's BP has been elevated in the last few weeks. She reports  150-160 mmHg systolic.  She states the diastolic readings have been better, in the 70's mmHg. She states patient is taking his lisinopril and bystolic as prescribed. I advised her to have him limit sodium intake to 2 grams daily and scheduled him to see Dr. Elease HashimotoNahser Thursday 3/1 at 11:00. Wife verbalized understanding and agreement and thanked me for the call.

## 2016-05-06 NOTE — Telephone Encounter (Signed)
New message ° ° ° ° ° ° °Wife request to talk to the nurse again. °

## 2016-05-06 NOTE — Telephone Encounter (Signed)
New Message  Pt wife call requesting to speak with RN. Pt wife states pt had an episode over the weekend and would like to speak with RN about getting pt in to be seen by only Dr. Elease HashimotoNahser. Please call back to discuss

## 2016-05-09 ENCOUNTER — Ambulatory Visit (INDEPENDENT_AMBULATORY_CARE_PROVIDER_SITE_OTHER): Payer: BLUE CROSS/BLUE SHIELD | Admitting: Cardiovascular Disease

## 2016-05-09 ENCOUNTER — Encounter: Payer: Self-pay | Admitting: Cardiovascular Disease

## 2016-05-09 VITALS — BP 126/70 | HR 72 | Ht 66.0 in | Wt 177.6 lb

## 2016-05-09 DIAGNOSIS — I110 Hypertensive heart disease with heart failure: Secondary | ICD-10-CM

## 2016-05-09 DIAGNOSIS — Z13220 Encounter for screening for lipoid disorders: Secondary | ICD-10-CM | POA: Diagnosis not present

## 2016-05-09 MED ORDER — HYDROCHLOROTHIAZIDE 25 MG PO TABS: 25.0000 mg | ORAL_TABLET | Freq: Every day | ORAL | 3 refills | Status: DC

## 2016-05-09 MED ORDER — POTASSIUM CHLORIDE ER 10 MEQ PO TBCR: 10.0000 meq | EXTENDED_RELEASE_TABLET | Freq: Every day | ORAL | 3 refills | Status: DC

## 2016-05-09 MED ORDER — LISINOPRIL 40 MG PO TABS: 40.0000 mg | ORAL_TABLET | Freq: Every day | ORAL | 3 refills | Status: DC

## 2016-05-09 NOTE — Patient Instructions (Addendum)
Medication Instructions:  STOP Bystolic   Labwork: TODAY - cholesterol, complete metabolic panel   Testing/Procedures: None Ordered   Follow-Up: Your physician wants you to follow-up in: 1 year with Dr. Elease HashimotoNahser.  You will receive a reminder letter in the mail two months in advance. If you don't receive a letter, please call our office to schedule the follow-up appointment.   If you need a refill on your cardiac medications before your next appointment, please call your pharmacy.   Thank you for choosing [facility]! Eligha BridegroomMichelle Arlis Yale, RN [phone]

## 2016-05-09 NOTE — Progress Notes (Signed)
Cardiology Office Note   Date:  05/09/2016   ID:  Greg Livingston, DOB Jun 13, 1955, MRN [id number]  PCP:  Johny Blamer, MD  Cardiologist:   [name], MD   Chief Complaint  Patient presents with  . Hypertension   Problem List: 1. HTN 2. diabetes mellitus 3. Left ventricular hypertrophy 4. Diastolic dysfunction-by an echocardiogram performed in [place], [place].  History of Present Illness:  Greg Livingston Is a 61 yo from [place]. Presents for further evaluation of HTN.  He has had a variable BP . He was this increase several weeks ago and his blood pressure was as high as 210/110. He has been keeping a BP log.   His mother has history of diabetes and high blood pressure. 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   October 05, 2013: Greg Livingston is doing well. BP is better. We started him in HCTZ at his last visit.   May 16, 2014:  Greg Livingston is a 61 y.o. male who presents for follow-up of his hypertension. His blood pressure has been well controlled. He still exercises on a regular basis.  He is feeling great.    May 15, 2015: Greg Livingston is seen back today for a 1 year visit.   Hx of HTN .   No CP or dyspnea.     May 09, 2016:  Greg Livingston is seen today for follow up of his HTN  BP is good today  BP has been a little elevated after he ran out of his HCTZ   Still eating some salt . He has been out of the [facility] for a month   Past Medical History:  Diagnosis Date  . Anxiety state, unspecified   . Diabetes mellitus without complication (HCC)   . Esophageal reflux   . First degree AV block   . Hypertension   . Impotence of organic origin   . Osteoarthrosis, unspecified whether generalized or localized, other specified sites   . Other and unspecified hyperlipidemia   . Other atopic dermatitis and related conditions   . Pain in joint, pelvic region and thigh   . Pure hypercholesterolemia      Past Surgical History:  Procedure Laterality Date  . PARTIAL HIP ARTHROPLASTY       Current Outpatient Prescriptions  Medication Sig Dispense Refill  . aspirin 81 MG tablet Take 81 mg by mouth daily.    Marland Kitchen glipiZIDE-metformin (METAGLIP) 2.5-500 MG per tablet Take 2 tablets by mouth 2 (two) times daily before a meal.    . hydrochlorothiazide (HYDRODIURIL) 25 MG tablet Take 1 tablet (25 mg total) by mouth daily. 15 tablet 0  . lisinopril (PRINIVIL,ZESTRIL) 40 MG tablet Take 1 tablet (40 mg total) by mouth daily. 90 tablet 3  . potassium chloride (K-DUR) 10 MEQ tablet Take 1 tablet (10 mEq total) by mouth daily. 90 tablet 3   No current facility-administered medications for this visit.     Allergies:   Patient has no known allergies.    Social History:  The patient  reports that he has never smoked. He has never used smokeless tobacco. He reports that he does not drink alcohol or use drugs.   Family History:  The patient's family history includes Diabetes in his mother.    ROS:  Please see the history of present illness.    Review of Systems: Constitutional:  denies fever, chills, diaphoresis, appetite change and fatigue.  HEENT: denies  photophobia, eye pain, redness, hearing loss, ear pain, congestion, sore throat, rhinorrhea, sneezing, neck pain, neck stiffness and tinnitus.  Respiratory: denies SOB, DOE, cough, chest tightness, and wheezing.  Cardiovascular: denies chest pain, palpitations and leg swelling.  Gastrointestinal: denies nausea, vomiting, abdominal pain, diarrhea, constipation, blood in stool.  Genitourinary: denies dysuria, urgency, frequency, hematuria, flank pain and difficulty urinating.  Musculoskeletal: denies  myalgias, back pain, joint swelling, arthralgias and gait problem.   Skin: denies pallor, rash and wound.  Neurological: denies dizziness, seizures, syncope, weakness, light-headedness, numbness and headaches.   Hematological: denies adenopathy,  easy bruising, personal or family bleeding history.  Psychiatric/ Behavioral: denies suicidal ideation, mood changes, confusion, nervousness, sleep disturbance and agitation.      All other systems are reviewed and negative.   PHYSICAL EXAM: VS:  BP 126/70 (BP Location: Left Arm, Patient Position: Sitting, Cuff Size: Normal)   Pulse 72   Ht 5\' 6"  (1.676 m)   Wt 177 lb 9.6 oz (80.6 kg)   SpO2 94%   BMI 28.67 kg/m  , BMI Body mass index is 28.67 kg/m. GEN: Well nourished, well developed, in no acute distress  HEENT: normal  Neck: no JVD, carotid bruits, or masses Cardiac: RRR; no murmurs, rubs, or gallops,no edema  Respiratory:  clear to auscultation bilaterally, normal work of breathing GI: soft, nontender, nondistended, + BS MS: no deformity or atrophy  Skin: warm and dry, no rash Neuro:  Strength and sensation are intact Psych: normal   EKG:  EKG is ordered today. Personally reviewed - NSR at 72.   1st degree AV block    Recent Labs: No results found for requested labs within last 8760 hours.    Lipid Panel No results found for: CHOL, TRIG, HDL, CHOLHDL, VLDL, LDLCALC, LDLDIRECT    Wt Readings from Last 3 Encounters:  05/09/16 177 lb 9.6 oz (80.6 kg)  05/15/15 182 lb 12.8 oz (82.9 kg)  05/16/14 179 lb 12.8 oz (81.6 kg)      Other studies Reviewed: Additional studies/ records that were reviewed today include: . Review of the above records demonstrates:    ASSESSMENT AND PLAN:  1. HTN - his blood pressure is very well-controlled. He ran out of the  Bystolic a month ago and his BP and HR are well controlled.  Continue current medications. We will refill his hypertensive medications for 90 days with 3 refills  . I'll see him again in one year.  2. diabetes mellitus - followed by his general medical doctor.  3. Left ventricular hypertrophy-  continue current medications.  4. Diastolic dysfunction-by an echocardiogram performed in [place], [place].  Continue  current medications. BP has been well controlled.   5.  Will get screening lipid levels  Today .   Current medicines are reviewed at length with the patient today.  The patient does not have concerns regarding medicines.  The following changes have been made:  no change   Disposition:   FU with me in one year.    Signed, Kristeen MissPhilip Khrystina Bonnes, MD  05/09/2016 11:57 AM    [facility] [address], [place], [place]  [address] Phone: [phone](336) [phone]; Fax: [phone](336) [phone]

## 2016-05-10 LAB — COMPREHENSIVE METABOLIC PANEL
ALBUMIN: 4.8 g/dL (ref 3.5–5.5)
ALK PHOS: 50 IU/L (ref 39–117)
ALT: 30 IU/L (ref 0–44)
AST: 26 IU/L (ref 0–40)
Albumin/Globulin Ratio: 1.8 (ref 1.2–2.2)
BUN / CREAT RATIO: 9 (ref 9–20)
BUN: 10 mg/dL (ref 6–24)
Bilirubin Total: 1.2 mg/dL (ref 0.0–1.2)
CO2: 28 mmol/L (ref 18–29)
CREATININE: 1.07 mg/dL (ref 0.76–1.27)
Calcium: 9.9 mg/dL (ref 8.7–10.2)
Chloride: 94 mmol/L — ABNORMAL LOW (ref 96–106)
GFR calc Af Amer: 87 mL/min/{1.73_m2} (ref >59)
GFR, EST NON AFRICAN AMERICAN: 76 mL/min/{1.73_m2} (ref >59)
Globulin, Total: 2.7 g/dL (ref 1.5–4.5)
Glucose: 174 mg/dL — ABNORMAL HIGH (ref 65–99)
Potassium: 4.2 mmol/L (ref 3.5–5.2)
SODIUM: 140 mmol/L (ref 134–144)
Total Protein: 7.5 g/dL (ref 6.0–8.5)

## 2016-05-10 LAB — LIPID PANEL
CHOL/HDL RATIO: 2.9 ratio (ref 0.0–5.0)
Cholesterol, Total: 112 mg/dL (ref 100–199)
HDL: 38 mg/dL — AB (ref >39)
LDL CALC: 55 mg/dL (ref 0–99)
TRIGLYCERIDES: 95 mg/dL (ref 0–149)
VLDL Cholesterol Cal: 19 mg/dL (ref 5–40)

## 2016-06-17 DIAGNOSIS — F419 Anxiety disorder, unspecified: Secondary | ICD-10-CM | POA: Diagnosis not present

## 2016-06-17 DIAGNOSIS — Z125 Encounter for screening for malignant neoplasm of prostate: Secondary | ICD-10-CM | POA: Diagnosis not present

## 2016-06-17 DIAGNOSIS — E7800 Pure hypercholesterolemia, unspecified: Secondary | ICD-10-CM | POA: Diagnosis not present

## 2016-06-17 DIAGNOSIS — I10 Essential (primary) hypertension: Secondary | ICD-10-CM | POA: Diagnosis not present

## 2016-06-17 DIAGNOSIS — E119 Type 2 diabetes mellitus without complications: Secondary | ICD-10-CM | POA: Diagnosis not present

## 2016-06-28 DIAGNOSIS — J00 Acute nasopharyngitis [common cold]: Secondary | ICD-10-CM | POA: Diagnosis not present

## 2016-07-08 ENCOUNTER — Ambulatory Visit
Admission: RE | Admit: 2016-07-08 | Discharge: 2016-07-08 | Disposition: A | Discharge status: Home / Self Care | Payer: BLUE CROSS/BLUE SHIELD | Source: Ambulatory Visit | Attending: Family Medicine | Admitting: Family Medicine

## 2016-07-08 ENCOUNTER — Other Ambulatory Visit: Payer: Self-pay | Admitting: Family Medicine

## 2016-07-08 DIAGNOSIS — R05 Cough: Secondary | ICD-10-CM | POA: Diagnosis not present

## 2016-07-08 DIAGNOSIS — R059 Cough, unspecified: Secondary | ICD-10-CM

## 2016-07-08 DIAGNOSIS — I10 Essential (primary) hypertension: Secondary | ICD-10-CM | POA: Diagnosis not present

## 2016-07-08 DIAGNOSIS — F419 Anxiety disorder, unspecified: Secondary | ICD-10-CM | POA: Diagnosis not present

## 2016-08-09 ENCOUNTER — Ambulatory Visit (INDEPENDENT_AMBULATORY_CARE_PROVIDER_SITE_OTHER): Payer: BLUE CROSS/BLUE SHIELD

## 2016-08-09 ENCOUNTER — Encounter: Payer: Self-pay | Admitting: Podiatry

## 2016-08-09 ENCOUNTER — Ambulatory Visit (INDEPENDENT_AMBULATORY_CARE_PROVIDER_SITE_OTHER): Payer: BLUE CROSS/BLUE SHIELD | Admitting: Podiatry

## 2016-08-09 DIAGNOSIS — M722 Plantar fascial fibromatosis: Secondary | ICD-10-CM | POA: Diagnosis not present

## 2016-08-09 DIAGNOSIS — M7752 Other enthesopathy of left foot: Secondary | ICD-10-CM | POA: Diagnosis not present

## 2016-08-10 NOTE — Progress Notes (Signed)
   HPI: Patient is a 61 year old male with past medical history of type 2 diabetes presenting today with a complaint of intermittent pain to the lateral side of the left foot on the plantar aspect that began 4-5 months ago. Walking barefoot increases the pain. He denies alleviating factors. He has not done anything for treatment. He is here for further evaluation and treatment.    Physical Exam: General: The patient is alert and oriented x3 in no acute distress.  Dermatology: Skin is warm, dry and supple bilateral lower extremities. Negative for open lesions or macerations.  Vascular: Palpable pedal pulses bilaterally. No edema or erythema noted. Capillary refill within normal limits.  Neurological: Epicritic and protective threshold grossly intact bilaterally.   Musculoskeletal Exam: He with palpation to the fifth MPJ of the left foot. Range of motion within normal limits to all pedal and ankle joints bilateral. Muscle strength 5/5 in all groups bilateral.   Radiographic Exam:  Normal osseous mineralization. Joint spaces preserved. No fracture/dislocation/boney destruction.    Assessment: 1. Capsulitis fifth MPJ left   Plan of Care:  1. Patient was evaluated. X-rays reviewed. 2. Injection of 0.5 mLs Celestone Soluspan injected into fifth MPJ of the left foot. 3. Met pads dispensed. 4. Return to clinic in 4 weeks.  Works at [employer].    Edrick Kins, DPM [facility]  Dr. Edrick Kins, [place]                                        [place], [place] [address]                Office [phone]  Fax [phone]

## 2016-08-12 DIAGNOSIS — E113393 Type 2 diabetes mellitus with moderate nonproliferative diabetic retinopathy without macular edema, bilateral: Secondary | ICD-10-CM | POA: Diagnosis not present

## 2016-08-31 MED ORDER — BETAMETHASONE SOD PHOS & ACET 6 (3-3) MG/ML IJ SUSP: 3.0000 mg | Freq: Once | INTRAMUSCULAR | Status: AC

## 2016-09-16 DIAGNOSIS — N529 Male erectile dysfunction, unspecified: Secondary | ICD-10-CM | POA: Diagnosis not present

## 2016-09-16 DIAGNOSIS — E7800 Pure hypercholesterolemia, unspecified: Secondary | ICD-10-CM | POA: Diagnosis not present

## 2016-09-16 DIAGNOSIS — E119 Type 2 diabetes mellitus without complications: Secondary | ICD-10-CM | POA: Diagnosis not present

## 2016-09-16 DIAGNOSIS — I10 Essential (primary) hypertension: Secondary | ICD-10-CM | POA: Diagnosis not present

## 2016-09-17 ENCOUNTER — Ambulatory Visit: Payer: BLUE CROSS/BLUE SHIELD | Admitting: Podiatry

## 2016-09-20 ENCOUNTER — Ambulatory Visit (INDEPENDENT_AMBULATORY_CARE_PROVIDER_SITE_OTHER): Payer: BLUE CROSS/BLUE SHIELD | Admitting: Podiatry

## 2016-09-20 ENCOUNTER — Encounter: Payer: Self-pay | Admitting: Podiatry

## 2016-09-20 DIAGNOSIS — M7752 Other enthesopathy of left foot: Secondary | ICD-10-CM

## 2016-09-29 MED ORDER — BETAMETHASONE SOD PHOS & ACET 6 (3-3) MG/ML IJ SUSP: 3.0000 mg | Freq: Once | INTRAMUSCULAR | Status: AC

## 2016-09-29 NOTE — Progress Notes (Signed)
   HPI: Patient presents today for follow-up treatment and evaluation of capsulitis to the fifth MTPJ left foot. Patient states that the pain is still the same. He says that the injection helped some as well as the metatarsal pads. Patient works on his feet all day long and continues to experience pain.    Physical Exam: General: The patient is alert and oriented x3 in no acute distress.  Dermatology: Skin is warm, dry and supple bilateral lower extremities. Negative for open lesions or macerations.  Vascular: Palpable pedal pulses bilaterally. No edema or erythema noted. Capillary refill within normal limits.  Neurological: Epicritic and protective threshold grossly intact bilaterally.   Musculoskeletal Exam: Pain on palpation and range of motion to the fifth MPJ left foot. Range of motion within normal limits to all pedal and ankle joints bilateral. Muscle strength 5/5 in all groups bilateral.   Assessment: 1. Capsulitis fifth MTPJ left foot  Plan of Care:  1. Patient was evaluated. 2. Injection of 0.5 mL Celestone Soluspan injected in fifth MTPJ left foot 3. Today were going to modify the offloading metatarsal pads into a dancer's pad. 4. Continue good shoe gear 5. Return to clinic in 4 weeks. If the patient is not better we will dispense a postoperative shoe  Patient owns [employer]  Felecia ShellingBrent M. Jaidyn Kuhl, [place] [facility]  Dr. Felecia ShellingBrent M. Eran Mistry, DPM    [address]                                        [place], [place] [address]                Office [phone]  Fax [phone]

## 2016-10-15 ENCOUNTER — Encounter: Payer: Self-pay | Admitting: Podiatry

## 2016-10-15 ENCOUNTER — Ambulatory Visit (INDEPENDENT_AMBULATORY_CARE_PROVIDER_SITE_OTHER): Payer: BLUE CROSS/BLUE SHIELD | Admitting: Podiatry

## 2016-10-15 DIAGNOSIS — M7752 Other enthesopathy of left foot: Secondary | ICD-10-CM

## 2016-10-19 MED ORDER — BETAMETHASONE SOD PHOS & ACET 6 (3-3) MG/ML IJ SUSP: 3.0000 mg | Freq: Once | INTRAMUSCULAR | Status: AC

## 2016-10-19 NOTE — Progress Notes (Signed)
   HPI: 61 year old male presents today for follow-up treatment and evaluation regarding capsulitis to the fifth MTPJ left foot. Patient states that he is doing much better. He believes the injection and metatarsal pads are helping significantly.   Physical Exam: General: The patient is alert and oriented x3 in no acute distress.  Dermatology: Skin is warm, dry and supple bilateral lower extremities. Negative for open lesions or macerations.  Vascular: Palpable pedal pulses bilaterally. No edema or erythema noted. Capillary refill within normal limits.  Neurological: Epicritic and protective threshold grossly intact bilaterally.   Musculoskeletal Exam: Pain on palpation and range of motion to the fifth MPJ left foot. Range of motion within normal limits to all pedal and ankle joints bilateral. Muscle strength 5/5 in all groups bilateral.   Assessment: 1. Capsulitis fifth MTPJ left foot  Plan of Care:  1. Patient was evaluated. 2. Injection of 0.5 mL Celestone Soluspan injected in fifth MTPJ left foot 3. Today were going to modify the offloading metatarsal pads into a dancer's pad. Dancer's pads were dispensed today 4. Return to clinic when necessary  Patient owns [facility]  Felecia ShellingBrent M. Grayce Budden, [place] [facility]  Dr. Felecia ShellingBrent M. Merrily Tegeler, DPM    [address]                                        [place], [place] [address]                Office [phone]  Fax [phone]

## 2017-01-20 DIAGNOSIS — E113393 Type 2 diabetes mellitus with moderate nonproliferative diabetic retinopathy without macular edema, bilateral: Secondary | ICD-10-CM | POA: Diagnosis not present

## 2017-02-10 DIAGNOSIS — L308 Other specified dermatitis: Secondary | ICD-10-CM | POA: Diagnosis not present

## 2017-02-19 ENCOUNTER — Telehealth: Payer: Self-pay | Admitting: Cardiovascular Disease

## 2017-02-19 NOTE — Telephone Encounter (Signed)
New message     Pt c/o of Chest Pain: STAT if CP now or developed within 24 hours  1. Are you having CP right now?  Yes 8 or 9 days 2. Are you experiencing any other symptoms (ex. SOB, nausea, vomiting, sweating)?  No symptoms   3. How long have you been experiencing CP?  8-9 days  4. Is your CP continuous or coming and going?  Comes and goes   5. Have you taken Nitroglycerin?  no ?

## 2017-02-19 NOTE — Telephone Encounter (Signed)
This pain sound like pleuretic CP  - does not sound cardiac See if it improves with ibuprofen

## 2017-02-19 NOTE — Telephone Encounter (Signed)
I spoke with patient at [phone], his cell number.  Pt states for the last month he has had a sharp pain beside his heart in the front, only when he takes a deep breath. Pt states last time he had pain was yesterday morning, lasted minutes, usually helps to sit down, no pattern to frequency, but usually around 10 AM-11AM several times a week. Pt denies any other symptoms including shortness of breath, nausea, cough, upper respiratory illness. Pt denies recent travel, heavy lifting or moving, any activity out of his usual routine. 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  Pt advised I will forward to Dr Elease HashimotoNahser for review

## 2017-02-19 NOTE — Telephone Encounter (Signed)
Spoke with patient to review Dr. Harvie BridgeNahser's advice. He states at times he feels that his heart is too large. He would not expound with additional information but denies SOB. He states he wants to see Dr. Elease HashimotoNahser and only him and requests soonest possible appointment. Dr. Elease HashimotoNahser agrees to see him Tuesday 12/18 at 0740. I advised patient to take ibuprofen over the next few days to see if this helps with his chest discomfort. He verbalized understanding and agreement and thanked me for the call.

## 2017-02-25 ENCOUNTER — Ambulatory Visit: Payer: BLUE CROSS/BLUE SHIELD | Admitting: Cardiovascular Disease

## 2017-02-25 ENCOUNTER — Encounter: Payer: Self-pay | Admitting: Cardiovascular Disease

## 2017-02-25 VITALS — BP 134/70 | HR 74 | Ht 65.0 in | Wt 181.4 lb

## 2017-02-25 DIAGNOSIS — I493 Ventricular premature depolarization: Secondary | ICD-10-CM | POA: Diagnosis not present

## 2017-02-25 DIAGNOSIS — R0789 Other chest pain: Secondary | ICD-10-CM

## 2017-02-25 DIAGNOSIS — I10 Essential (primary) hypertension: Secondary | ICD-10-CM

## 2017-02-25 LAB — BASIC METABOLIC PANEL
BUN / CREAT RATIO: 13 (ref 10–24)
BUN: 14 mg/dL (ref 8–27)
CHLORIDE: 96 mmol/L (ref 96–106)
CO2: 25 mmol/L (ref 20–29)
Calcium: 9.8 mg/dL (ref 8.6–10.2)
Creatinine, Ser: 1.11 mg/dL (ref 0.76–1.27)
GFR calc non Af Amer: 72 mL/min/{1.73_m2} (ref >59)
GFR, EST AFRICAN AMERICAN: 83 mL/min/{1.73_m2} (ref >59)
GLUCOSE: 256 mg/dL — AB (ref 65–99)
POTASSIUM: 4.1 mmol/L (ref 3.5–5.2)
SODIUM: 139 mmol/L (ref 134–144)

## 2017-02-25 LAB — TSH: TSH: 0.863 u[IU]/mL (ref 0.450–4.500)

## 2017-02-25 NOTE — Patient Instructions (Signed)
Medication Instructions:  Your physician recommends that you continue on your current medications as directed. Please refer to the Current Medication list given to you today.   Labwork: TODAY - basic metabolic panel, thyroid stimulating hormone   Testing/Procedures: Your physician has requested that you have an echocardiogram after December 31. Echocardiography is a painless test that uses sound waves to create images of your heart. It provides your doctor with information about the size and shape of your heart and how well your heart's chambers and valves are working. This procedure takes approximately one hour. There are no restrictions for this procedure.  Your physician has requested that you have an exercise tolerance test after December 31. For further information please visit [email or web address]. Please also follow instruction sheet, as given.   Follow-Up: Your physician recommends that you schedule a follow-up appointment in: 1-2 months with Dr. Elease HashimotoNahser   If you need a refill on your cardiac medications before your next appointment, please call your pharmacy.   Thank you for choosing [facility]! Eligha BridegroomMichelle Daemion Mcniel, RN [phone]

## 2017-02-25 NOTE — Progress Notes (Signed)
Cardiology Office Note   Date:  02/25/2017   ID:  Greg Livingston, DOB 09-20-1955, MRN [id number]  PCP:  Johny BlamerHarris, William, MD  Cardiologist:   Kristeen MissPhilip Skyy Mcknight, MD   Chief Complaint  Patient presents with  . Chest Pain   Problem List: 1. HTN 2. diabetes mellitus 3. Left ventricular hypertrophy 4. Diastolic dysfunction-by [place].  History of Present Illness:  Greg Livingston Is a 61 yo from [place]. Presents for further evaluation of HTN.  He has had a variable BP . He was this increase several weeks ago and his blood pressure was as high as 210/110. He has been keeping a BP log.   His mother has history of diabetes and high blood pressure. 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   October 05, 2013: Greg Livingston is doing well. BP is better. We started him in HCTZ at his last visit.   May 16, 2014:  Greg Livingston is a 61 y.o. male who presents for follow-up of his hypertension. His blood pressure has been well controlled. He still exercises on a regular basis.  He is feeling great.    May 15, 2015: Greg Livingston is seen back today for a 1 year visit.   Hx of HTN .   No CP or dyspnea.     May 09, 2016:  Greg Livingston is seen today for follow up of his HTN  BP is good today  BP has been a little elevated after he ran out of his HCTZ   Still eating some salt . He has been out of the Bystolic for a month  February 25, 2017:  Here to evaluate chest tightness with deep breath For the past month or so,  On and off.   Is not associated with exertion Not sharp.   Is a full sensation.  No knife like or burning.  Not associated with twising or turning his torso. No fever, no cough or cold .  Goes to chiropractor twice a week.    Has tried some ASA 81 mg once a day .   No leg swelling , no dyspnea. .        Past Medical History:  Diagnosis Date  . Anxiety state, unspecified    . Diabetes mellitus without complication (HCC)   . Esophageal reflux   . First degree AV block   . Hypertension   . Impotence of organic origin   . Osteoarthrosis, unspecified whether generalized or localized, other specified sites   . Other and unspecified hyperlipidemia   . Other atopic dermatitis and related conditions   . Pain in joint, pelvic region and thigh   . Pure hypercholesterolemia     Past Surgical History:  Procedure Laterality Date  . PARTIAL HIP ARTHROPLASTY       Current Outpatient Medications  Medication Sig Dispense Refill  . aspirin 81 MG tablet Take 81 mg by mouth daily.    Marland Kitchen. glipiZIDE-metformin (METAGLIP) 2.5-500 MG per tablet Take 2 tablets by mouth 2 (two) times daily before a meal.    . hydrochlorothiazide (HYDRODIURIL) 25 MG tablet Take 1 tablet (25 mg total) by mouth daily. 90 tablet 3  . lisinopril (PRINIVIL,ZESTRIL) 40 MG tablet Take 1 tablet (40 mg total) by mouth daily. 90 tablet 3  . potassium chloride (K-DUR) 10 MEQ tablet Take 1 tablet (10 mEq total) by mouth daily. 90 tablet 3   Current  Facility-Administered Medications  Medication Dose Route Frequency Provider Last Rate Last Dose  . betamethasone acetate-betamethasone sodium phosphate (CELESTONE) injection 3 mg  3 mg Intramuscular Once Gala LewandowskyEvans, Brent M, DPM      . betamethasone acetate-betamethasone sodium phosphate (CELESTONE) injection 3 mg  3 mg Intramuscular Once Gala LewandowskyEvans, Brent M, DPM      . betamethasone acetate-betamethasone sodium phosphate (CELESTONE) injection 3 mg  3 mg Intramuscular Once Felecia ShellingEvans, Brent M, DPM        Allergies:   Patient has no known allergies.    Social History:  The patient  reports that  has never smoked. he has never used smokeless tobacco. He reports that he does not drink alcohol or use drugs.   Family History:  The patient's family history includes Diabetes in his mother.    ROS: Noted in the current history.  All other systems are negative.  Physical  Exam: Blood pressure 134/70, pulse 74, height 5\' 5"  (1.651 m), weight 181 lb 6.4 oz (82.3 kg), SpO2 95 %.  GEN:  Well nourished, well developed in no acute distress HEENT: Normal NECK: No JVD; No carotid bruits LYMPHATICS: No lymphadenopathy CARDIAC: RR, occasional PVCs  , no murmurs, rubs, gallops RESPIRATORY:  Clear to auscultation without rales, wheezing or rhonchi  ABDOMEN: Soft, non-tender, non-distended MUSCULOSKELETAL:  No edema; No deformity  SKIN: Warm and dry NEUROLOGIC:  Alert and oriented x 3   EKG:    Dec. 18, 2018:    NSR at 74.   1st degree AV block ,  PVCs   Recent Labs: 05/09/2016: ALT 30; BUN 10; Creatinine, Ser 1.07; Potassium 4.2; Sodium 140    Lipid Panel    Component Value Date/Time   CHOL 112 05/09/2016 1230   TRIG 95 05/09/2016 1230   HDL 38 (L) 05/09/2016 1230   CHOLHDL 2.9 05/09/2016 1230   LDLCALC 55 05/09/2016 1230      Wt Readings from Last 3 Encounters:  02/25/17 181 lb 6.4 oz (82.3 kg)  05/09/16 177 lb 9.6 oz (80.6 kg)  05/15/15 182 lb 12.8 oz (82.9 kg)      Other studies Reviewed: Additional studies/ records that were reviewed today include: . Review of the above records demonstrates:    ASSESSMENT AND PLAN:  1. Chest pain : Greg Livingston is having atypical pleuritic chest pain.  He does not have any symptoms consistent with a pulmonary embolus.  He is not tachycardic and not short of breath.  Pleuritic chest pain is very intermittent.  It is not worsened with lying down and relieved with sitting up.  I doubt that it is pericarditis.  We will get an echocardiogram for further evaluation.  We will also order a stress test.  Greg Livingston his mother died this week.  He is leaving for [place] today.  He will be returning on December 31 and will get the studies done.  I have told him and his wife that I have a very low suspicion that this is an acute cardiac syndrome.  I have instructed him to go to the hospital increase if the symptoms acutely  worsen.  2.  Premature ventricular contractions: Greg Livingston is having premature ventricular contractions today.  We will check a basic metabolic profile and a TSH.  He seems to be asymptomatic.  2. HTN -   Blood pressure is well controlled.  3. Left ventricular hypertrophy-  continue current medications.  4. Diastolic dysfunction-by [place] in [place].  Continue current medications. BP has been  well controlled.   5.  Will get screening lipid levels  Today .   Current medicines are reviewed at length with the patient today.  The patient does not have concerns regarding medicines.  The following changes have been made:  no change   Disposition:   FU with me in one year.    Signed, [name], MD  02/25/2017 8:04 AM    [facility] [address] [place], [place], [place]  [address] Phone: [phone]; Fax: [phone]

## 2017-02-26 ENCOUNTER — Encounter: Payer: Self-pay | Admitting: Cardiovascular Disease

## 2017-03-18 ENCOUNTER — Other Ambulatory Visit: Payer: Self-pay

## 2017-03-18 ENCOUNTER — Ambulatory Visit (INDEPENDENT_AMBULATORY_CARE_PROVIDER_SITE_OTHER): Payer: BLUE CROSS/BLUE SHIELD

## 2017-03-18 ENCOUNTER — Ambulatory Visit (HOSPITAL_COMMUNITY): Payer: BLUE CROSS/BLUE SHIELD | Attending: Cardiology

## 2017-03-18 DIAGNOSIS — I493 Ventricular premature depolarization: Secondary | ICD-10-CM | POA: Diagnosis not present

## 2017-03-18 DIAGNOSIS — I10 Essential (primary) hypertension: Secondary | ICD-10-CM

## 2017-03-18 DIAGNOSIS — R0789 Other chest pain: Secondary | ICD-10-CM | POA: Diagnosis not present

## 2017-03-18 LAB — EXERCISE TOLERANCE TEST
CSEPED: 8 min
CSEPEW: 10.1 METS
CSEPPHR: 136 {beats}/min
Exercise duration (sec): 4 s
MPHR: 160 {beats}/min
Percent HR: 85 %
RPE: 16
Rest HR: 65 {beats}/min

## 2017-03-24 DIAGNOSIS — E119 Type 2 diabetes mellitus without complications: Secondary | ICD-10-CM | POA: Diagnosis not present

## 2017-03-24 DIAGNOSIS — Z125 Encounter for screening for malignant neoplasm of prostate: Secondary | ICD-10-CM | POA: Diagnosis not present

## 2017-03-24 DIAGNOSIS — I10 Essential (primary) hypertension: Secondary | ICD-10-CM | POA: Diagnosis not present

## 2017-03-24 DIAGNOSIS — Z0000 Encounter for general adult medical examination without abnormal findings: Secondary | ICD-10-CM | POA: Diagnosis not present

## 2017-03-24 DIAGNOSIS — E7800 Pure hypercholesterolemia, unspecified: Secondary | ICD-10-CM | POA: Diagnosis not present

## 2017-04-21 ENCOUNTER — Ambulatory Visit: Payer: BLUE CROSS/BLUE SHIELD | Admitting: Cardiovascular Disease

## 2017-04-21 ENCOUNTER — Encounter: Payer: Self-pay | Admitting: Cardiovascular Disease

## 2017-04-21 ENCOUNTER — Encounter (INDEPENDENT_AMBULATORY_CARE_PROVIDER_SITE_OTHER): Payer: Self-pay

## 2017-04-21 VITALS — BP 130/52 | HR 75 | Ht 65.0 in | Wt 182.8 lb

## 2017-04-21 DIAGNOSIS — R0789 Other chest pain: Secondary | ICD-10-CM

## 2017-04-21 DIAGNOSIS — I10 Essential (primary) hypertension: Secondary | ICD-10-CM | POA: Diagnosis not present

## 2017-04-21 LAB — BASIC METABOLIC PANEL WITH GFR
BUN/Creatinine Ratio: 11 (ref 10–24)
BUN: 12 mg/dL (ref 8–27)
CO2: 26 mmol/L (ref 20–29)
Calcium: 9.7 mg/dL (ref 8.6–10.2)
Chloride: 98 mmol/L (ref 96–106)
Creatinine, Ser: 1.13 mg/dL (ref 0.76–1.27)
GFR calc Af Amer: 81 mL/min/{1.73_m2}
GFR calc non Af Amer: 70 mL/min/{1.73_m2}
Glucose: 141 mg/dL — ABNORMAL HIGH (ref 65–99)
Potassium: 4 mmol/L (ref 3.5–5.2)
Sodium: 138 mmol/L (ref 134–144)

## 2017-04-21 NOTE — Patient Instructions (Signed)
Medication Instructions:  Your physician recommends that you continue on your current medications as directed. Please refer to the Current Medication list given to you today.   Labwork: TODAY - basic metabolic panel   Testing/Procedures: None Ordered   Follow-Up: Your physician wants you to follow-up in: 6 months with Dr. Nahser. You will receive a reminder letter in the mail two months in advance. If you don't receive a letter, please call our office to schedule the follow-up appointment.   If you need a refill on your cardiac medications before your next appointment, please call your pharmacy.   Thank you for choosing [facility]! Michelle Swinyer, RN [phone]    

## 2017-04-21 NOTE — Progress Notes (Signed)
Cardiology Office Note   Date:  04/21/2017   ID:  Greg Livingston, DOB [birth year], MRN [id number]  PCP:  Johny BlamerHarris, William, MD  Cardiologist:   Kristeen MissPhilip Samar Venneman, MD   Chief Complaint  Patient presents with  . Hypertension  . Palpitations   Problem List: 1. HTN 2. diabetes mellitus 3. Left ventricular hypertrophy 4. Diastolic dysfunction-by an echocardiogram performed in [place].  History of Present Illness:  Greg Livingston Is a 62 yo from [place]. Presents for further evaluation of HTN.  He has had a variable BP . He was this increase several weeks ago and his blood pressure was as high as 210/110. He has been keeping a BP log.   His mother has history of diabetes and high blood pressure. 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   October 05, 2013: Greg Livingston is doing well. BP is better. We started him in HCTZ at his last visit.   May 16, 2014:  Greg Livingston is a 62 y.o. male who presents for follow-up of his hypertension. His blood pressure has been well controlled. He still exercises on a regular basis.  He is feeling great.    May 15, 2015: Greg Livingston is seen back today for a 1 year visit.   Hx of HTN .   No CP or dyspnea.     May 09, 2016:  Greg Livingston is seen today for follow up of his HTN  BP is good today  BP has been a little elevated after he ran out of his HCTZ   Still eating some salt . He has been out of the Bystolic for a month  February 25, 2017:  Here to evaluate chest tightness with deep breath For the past month or so,  On and off.   Is not associated with exertion Not sharp.   Is a full sensation.  No knife like or burning.  Not associated with twising or turning his torso. No fever, no cough or cold .  Goes to chiropractor twice a week.    Has tried some ASA 81 mg once a day .   No leg swelling , no dyspnea. .       April 21, 2017:  Greg Livingston was seen in December.   He was having some pleuritic chest pain at that time.  He was leaving for [place] later that afternoon to attend his mother's funeral.     Was not short of breath , not tachycardia We Ordered a stress Myoview study and echocardiogram. If the echocardiogram reveals normal left ventricular systolic function.  He has grade 1 diastolic dysfunction. GXT was normal.  Rarely , still has some pleuretic CP,  Not nearly as bad as before  abot to walk with any worsening CP    Past Medical History:  Diagnosis Date  . Anxiety state, unspecified   . Diabetes mellitus without complication (HCC)   . Esophageal reflux   . First degree AV block   . Hypertension   . Impotence of organic origin   . Osteoarthrosis, unspecified whether generalized or localized, other specified sites   . Other and unspecified hyperlipidemia   . Other atopic dermatitis and related conditions   . Pain in joint, pelvic region and thigh   . Pure hypercholesterolemia     Past Surgical History:  Procedure Laterality Date  . PARTIAL HIP ARTHROPLASTY       Current Outpatient Medications  Medication  Sig Dispense Refill  . aspirin 81 MG tablet Take 81 mg by mouth daily.    Marland Kitchen glipiZIDE-metformin (METAGLIP) 2.5-500 MG per tablet Take 2 tablets by mouth 2 (two) times daily before a meal.    . hydrochlorothiazide (HYDRODIURIL) 25 MG tablet Take 1 tablet (25 mg total) by mouth daily. 90 tablet 3  . lisinopril (PRINIVIL,ZESTRIL) 40 MG tablet Take 1 tablet (40 mg total) by mouth daily. 90 tablet 3  . potassium chloride (K-DUR) 10 MEQ tablet Take 1 tablet (10 mEq total) by mouth daily. 90 tablet 3   Current Facility-Administered Medications  Medication Dose Route Frequency Provider Last Rate Last Dose  . betamethasone acetate-betamethasone sodium phosphate (CELESTONE) injection 3 mg  3 mg Intramuscular Once Gala Lewandowsky M, DPM      . betamethasone acetate-betamethasone sodium phosphate (CELESTONE) injection 3 mg  3 mg Intramuscular  Once Gala Lewandowsky M, DPM      . betamethasone acetate-betamethasone sodium phosphate (CELESTONE) injection 3 mg  3 mg Intramuscular Once Felecia Shelling, DPM        Allergies:   Patient has no known allergies.    Social History:  The patient  reports that  has never smoked. he has never used smokeless tobacco. He reports that he does not drink alcohol or use drugs.   Family History:  The patient's family history includes Diabetes in his mother.    ROS: Noted in the current history.  All other systems are negative.  Physical Exam: Blood pressure (!) 130/52, pulse 75, height 5\' 5"  (1.651 m), weight 182 lb 12.8 oz (82.9 kg), SpO2 96 %.  GEN:  Well nourished, well developed in no acute distress HEENT: Normal NECK: No JVD; No carotid bruits LYMPHATICS: No lymphadenopathy CARDIAC: RR , no murmur  RESPIRATORY:  Clear to auscultation without rales, wheezing or rhonchi  ABDOMEN: Soft, non-tender, non-distended MUSCULOSKELETAL:  No edema; No deformity  SKIN: Warm and dry NEUROLOGIC:  Alert and oriented x 3   EKG:       Recent Labs: 05/09/2016: ALT 30 02/25/2017: BUN 14; Creatinine, Ser 1.11; Potassium 4.1; Sodium 139; TSH 0.863   Lipid Panel    Component Value Date/Time   CHOL 112 05/09/2016 1230   TRIG 95 05/09/2016 1230   HDL 38 (L) 05/09/2016 1230   CHOLHDL 2.9 05/09/2016 1230   LDLCALC 55 05/09/2016 1230      Wt Readings from Last 3 Encounters:  04/21/17 182 lb 12.8 oz (82.9 kg)  02/25/17 181 lb 6.4 oz (82.3 kg)  05/09/16 177 lb 9.6 oz (80.6 kg)      Other studies Reviewed: Additional studies/ records that were reviewed today include: . Review of the above records demonstrates:    ASSESSMENT AND PLAN:  1. Chest pain :  Has rare episodes of pleuretic CP  Seems to be much better   2.  Premature ventricular contractions:    2. HTN -   Blood pressure is well controlled.  3. Left ventricular hypertrophy-  continue current medications.  4. Diastolic  dysfunction-by an echocardiogram performed in [place], [place].  Continue current medications. BP has been well controlled.      Current medicines are reviewed at length with the patient today.  The patient does not have concerns regarding medicines.  The following changes have been made:  no change   Disposition:   FU with me in one year.    Signed, [name], MD  04/21/2017 8:53 AM    [facility]  [place], [place], [place]  [address] Phone: [phone]; Fax: [phone]

## 2017-05-29 ENCOUNTER — Telehealth: Payer: Self-pay | Admitting: Cardiovascular Disease

## 2017-05-29 NOTE — Telephone Encounter (Signed)
New Message   Pt c/o BP issue:  1. What are your last 5 BP readings? 152/82 2. Are you having any other symptoms (ex. Dizziness, headache, blurred vision, passed out)? None  3. What is your medication issue? Patient was prescribed some antibiotics (amoxicillin) for a tooth that he had pulled and  dental implant that he had done.

## 2017-05-29 NOTE — Telephone Encounter (Signed)
Spoke with patient's wife, Trinna Postlex, who states patient's BP has been elevated over the past few days since he has been taking amoxicillin for dental implant. She states that he has not had any pain. She states he has been eating soup made only with lemon and natural ingredients (no high sodium products) and that he is staying well hydrated. She confirms compliance with current medications and states he has been doing very well until this week. I asked if 152/82 is highest BP patient has gotten and she confirms. I advised her to continue to monitor after patient completes amoxicillin and to call back if BP remains elevated. She verbalized understanding and agreement with plan and thanked me for the call.

## 2017-07-18 ENCOUNTER — Other Ambulatory Visit: Payer: Self-pay | Admitting: Cardiovascular Disease

## 2017-08-19 DIAGNOSIS — J029 Acute pharyngitis, unspecified: Secondary | ICD-10-CM | POA: Diagnosis not present

## 2017-08-19 DIAGNOSIS — R05 Cough: Secondary | ICD-10-CM | POA: Diagnosis not present

## 2017-09-29 DIAGNOSIS — F419 Anxiety disorder, unspecified: Secondary | ICD-10-CM | POA: Diagnosis not present

## 2017-09-29 DIAGNOSIS — I10 Essential (primary) hypertension: Secondary | ICD-10-CM | POA: Diagnosis not present

## 2017-09-29 DIAGNOSIS — E119 Type 2 diabetes mellitus without complications: Secondary | ICD-10-CM | POA: Diagnosis not present

## 2017-09-29 DIAGNOSIS — E7800 Pure hypercholesterolemia, unspecified: Secondary | ICD-10-CM | POA: Diagnosis not present

## 2017-12-08 DIAGNOSIS — J00 Acute nasopharyngitis [common cold]: Secondary | ICD-10-CM | POA: Diagnosis not present

## 2017-12-08 DIAGNOSIS — J069 Acute upper respiratory infection, unspecified: Secondary | ICD-10-CM | POA: Diagnosis not present

## 2017-12-08 DIAGNOSIS — R0982 Postnasal drip: Secondary | ICD-10-CM | POA: Diagnosis not present

## 2018-01-09 ENCOUNTER — Encounter

## 2018-04-27 DIAGNOSIS — Z1159 Encounter for screening for other viral diseases: Secondary | ICD-10-CM | POA: Diagnosis not present

## 2018-04-27 DIAGNOSIS — E7800 Pure hypercholesterolemia, unspecified: Secondary | ICD-10-CM | POA: Diagnosis not present

## 2018-04-27 DIAGNOSIS — Z0000 Encounter for general adult medical examination without abnormal findings: Secondary | ICD-10-CM | POA: Diagnosis not present

## 2018-04-27 DIAGNOSIS — E119 Type 2 diabetes mellitus without complications: Secondary | ICD-10-CM | POA: Diagnosis not present

## 2018-04-27 DIAGNOSIS — Z125 Encounter for screening for malignant neoplasm of prostate: Secondary | ICD-10-CM | POA: Diagnosis not present

## 2018-04-27 DIAGNOSIS — N529 Male erectile dysfunction, unspecified: Secondary | ICD-10-CM | POA: Diagnosis not present

## 2018-04-27 DIAGNOSIS — I10 Essential (primary) hypertension: Secondary | ICD-10-CM | POA: Diagnosis not present

## 2018-05-02 DIAGNOSIS — H0011 Chalazion right upper eyelid: Secondary | ICD-10-CM | POA: Diagnosis not present

## 2018-05-12 ENCOUNTER — Other Ambulatory Visit: Payer: Self-pay | Admitting: Cardiovascular Disease

## 2018-11-02 DIAGNOSIS — E119 Type 2 diabetes mellitus without complications: Secondary | ICD-10-CM | POA: Diagnosis not present

## 2018-11-02 DIAGNOSIS — F419 Anxiety disorder, unspecified: Secondary | ICD-10-CM | POA: Diagnosis not present

## 2018-11-02 DIAGNOSIS — I10 Essential (primary) hypertension: Secondary | ICD-10-CM | POA: Diagnosis not present

## 2018-11-02 DIAGNOSIS — E7800 Pure hypercholesterolemia, unspecified: Secondary | ICD-10-CM | POA: Diagnosis not present

## 2018-12-07 DIAGNOSIS — I10 Essential (primary) hypertension: Secondary | ICD-10-CM | POA: Diagnosis not present

## 2018-12-07 DIAGNOSIS — E119 Type 2 diabetes mellitus without complications: Secondary | ICD-10-CM | POA: Diagnosis not present

## 2019-01-12 DIAGNOSIS — J40 Bronchitis, not specified as acute or chronic: Secondary | ICD-10-CM | POA: Diagnosis not present

## 2019-04-21 ENCOUNTER — Ambulatory Visit: Payer: BLUE CROSS/BLUE SHIELD | Admitting: Cardiovascular Disease

## 2019-05-03 DIAGNOSIS — I10 Essential (primary) hypertension: Secondary | ICD-10-CM | POA: Diagnosis not present

## 2019-05-03 DIAGNOSIS — F419 Anxiety disorder, unspecified: Secondary | ICD-10-CM | POA: Diagnosis not present

## 2019-05-03 DIAGNOSIS — Z23 Encounter for immunization: Secondary | ICD-10-CM | POA: Diagnosis not present

## 2019-05-03 DIAGNOSIS — E119 Type 2 diabetes mellitus without complications: Secondary | ICD-10-CM | POA: Diagnosis not present

## 2019-05-03 DIAGNOSIS — Z0000 Encounter for general adult medical examination without abnormal findings: Secondary | ICD-10-CM | POA: Diagnosis not present

## 2019-05-03 DIAGNOSIS — Z125 Encounter for screening for malignant neoplasm of prostate: Secondary | ICD-10-CM | POA: Diagnosis not present

## 2019-05-03 DIAGNOSIS — E7800 Pure hypercholesterolemia, unspecified: Secondary | ICD-10-CM | POA: Diagnosis not present

## 2019-05-19 ENCOUNTER — Other Ambulatory Visit: Payer: Self-pay

## 2019-05-19 ENCOUNTER — Ambulatory Visit: Payer: BLUE CROSS/BLUE SHIELD | Admitting: Cardiovascular Disease

## 2019-05-19 ENCOUNTER — Encounter: Payer: Self-pay | Admitting: Cardiovascular Disease

## 2019-05-19 VITALS — BP 138/66 | HR 68 | Ht 65.0 in | Wt 173.0 lb

## 2019-05-19 DIAGNOSIS — I10 Essential (primary) hypertension: Secondary | ICD-10-CM | POA: Diagnosis not present

## 2019-05-19 DIAGNOSIS — I119 Hypertensive heart disease without heart failure: Secondary | ICD-10-CM | POA: Diagnosis not present

## 2019-05-19 DIAGNOSIS — I5032 Chronic diastolic (congestive) heart failure: Secondary | ICD-10-CM | POA: Diagnosis not present

## 2019-05-19 NOTE — Progress Notes (Signed)
Cardiology Office Note   Date:  05/19/2019   ID:  Greg Livingston, DOB 15-Feb-1956, MRN [id number]  PCP:  Johny Blamer, MD  Cardiologist:   [name], MD   Chief Complaint  Patient presents with  . Hypertension   Problem List: 1. HTN 2. diabetes mellitus 3. Left ventricular hypertrophy 4. Diastolic dysfunction-by an echocardiogram performed in [place], [place].  Previous notes:   Greg Livingston Is a 64 yo from [place]. Presents for further evaluation of HTN.  He has had a variable BP . He was this increase several weeks ago and his blood pressure was as high as 210/110. He has been keeping a BP log.   His mother has history of diabetes and high blood pressure. 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   October 05, 2013: Greg Livingston is doing well. BP is better. We started him in HCTZ at his last visit.   May 16, 2014:  Greg Livingston is a 65 y.o. male who presents for follow-up of his hypertension. His blood pressure has been well controlled. He still exercises on a regular basis.  He is feeling great.    May 15, 2015: Greg Livingston is seen back today for a 1 year visit.   Hx of HTN .   No CP or dyspnea.     May 09, 2016:  Greg Livingston is seen today for follow up of his HTN  BP is good today  BP has been a little elevated after he ran out of his HCTZ   Still eating some salt . He has been out of the Bystolic for a month  February 25, 2017:  Here to evaluate chest tightness with deep breath For the past month or so,  On and off.   Is not associated with exertion Not sharp.   Is a full sensation.  No knife like or burning.  Not associated with twising or turning his torso. No fever, no cough or cold .  Goes to chiropractor twice a week.    Has tried some ASA 81 mg once a day .   No leg swelling , no dyspnea. .       April 21, 2017:  Greg Livingston was seen in December.  He was having some pleuritic  chest pain at that time.  He was leaving for [place] later that afternoon to attend his mother's funeral.     Was not short of breath , not tachycardia We Ordered a stress Myoview study and echocardiogram. If the echocardiogram reveals normal left ventricular systolic function.  He has grade 1 diastolic dysfunction. GXT was normal.  Rarely , still has some pleuretic CP,  Not nearly as bad as before  abot to walk with any worsening CP   May 19, 2019:  Greg Livingston is seen today for follow-up visit.  He has a history of chest pain in the past.  Echocardiogram in the past is revealed normal left ventricular systolic function with grade 1 diastolic dysfunction.  He had a normal stress test.  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 Anxiety state, unspecified   . Diabetes mellitus without complication (HCC)   . Esophageal reflux   . First degree AV block   . Hypertension   . Impotence of organic origin   . Osteoarthrosis, unspecified whether generalized or localized,  other specified sites   . Other and unspecified hyperlipidemia   . Other atopic dermatitis and related conditions   . Pain in joint, pelvic region and thigh   . Pure hypercholesterolemia     Past Surgical History:  Procedure Laterality Date  . PARTIAL HIP ARTHROPLASTY       Current Outpatient Medications  Medication Sig Dispense Refill  . aspirin 81 MG tablet Take 81 mg by mouth daily.    Marland Kitchen glipiZIDE-metformin (METAGLIP) 2.5-500 MG per tablet Take 2 tablets by mouth 2 (two) times daily before a meal.    . hydrochlorothiazide (HYDRODIURIL) 25 MG tablet Take 1 tablet (25 mg total) by mouth daily. 30 tablet 0  . lisinopril (PRINIVIL,ZESTRIL) 40 MG tablet Take 1 tablet (40 mg total) by mouth daily. 90 tablet 3  . potassium chloride (K-DUR) 10 MEQ tablet Take 1 tablet (10 mEq total) by mouth daily. 90 tablet 3   Current  Facility-Administered Medications  Medication Dose Route Frequency Provider Last Rate Last Admin  . betamethasone acetate-betamethasone sodium phosphate (CELESTONE) injection 3 mg  3 mg Intramuscular Once Daylene Katayama M, DPM      . betamethasone acetate-betamethasone sodium phosphate (CELESTONE) injection 3 mg  3 mg Intramuscular Once Daylene Katayama M, DPM      . betamethasone acetate-betamethasone sodium phosphate (CELESTONE) injection 3 mg  3 mg Intramuscular Once Edrick Kins, DPM        Allergies:   Patient has no known allergies.    Social History:  The patient  reports that he has never smoked. He has never used smokeless tobacco. He reports that he does not drink alcohol or use drugs.   Family History:  The patient's family history includes Diabetes in his mother.    ROS: Noted in the current history.  All other systems are negative.  Physical Exam: There were no vitals taken for this visit.  GEN:  Well nourished, well developed in no acute distress HEENT: Normal NECK: No JVD; No carotid bruits LYMPHATICS: No lymphadenopathy CARDIAC: RRR , no murmurs, rubs, gallops RESPIRATORY:  Clear to auscultation without rales, wheezing or rhonchi  ABDOMEN: Soft, non-tender, non-distended MUSCULOSKELETAL:  No edema; No deformity  SKIN: Warm and dry NEUROLOGIC:  Alert and oriented x 3    EKG:   May 19, 2019: Normal sinus rhythm at 68.  First-degree AV block.  No changes from previous EKG.    Recent Labs: No results found for requested labs within last 8760 hours.   Lipid Panel    Component Value Date/Time   CHOL 112 05/09/2016 1230   TRIG 95 05/09/2016 1230   HDL 38 (L) 05/09/2016 1230   CHOLHDL 2.9 05/09/2016 1230   LDLCALC 55 05/09/2016 1230      Wt Readings from Last 3 Encounters:  04/21/17 182 lb 12.8 oz (82.9 kg)  02/25/17 181 lb 6.4 oz (82.3 kg)  05/09/16 177 lb 9.6 oz (80.6 kg)      Other studies Reviewed: Additional studies/ records that were reviewed  today include: . Review of the above records demonstrates:    ASSESSMENT AND PLAN:  1. Chest pain :  No further episodes of CP.     2.  Premature ventricular contractions:   No recent palpitations     2. HTN -   BP is well controlled  Cont Losartan and HCTZ, labs managed by Dr. Kenton Kingfisher   3. Left ventricular hypertrophy-    4. Diastolic dysfunction-  No symptoms .   Cont  current meds.      Current medicines are reviewed at length with the patient today.  The patient does not have concerns regarding medicines.  The following changes have been made:  no change   Disposition:   FU with me in one year.    Signed, [name], MD  05/19/2019 6:13 AM    [facility] [address] [place], [place], [place]  [address] Phone: [phone]; Fax: [phone]

## 2019-05-19 NOTE — Patient Instructions (Signed)
Medication Instructions:  Your provider recommends that you continue on your current medications as directed. Please refer to the Current Medication list given to you today.   *If you need a refill on your cardiac medications before your next appointment, please call your pharmacy*  Follow-Up: At [facility], you and your health needs are our priority.  As part of our continuing mission to provide you with exceptional heart care, we have created designated Provider Care Teams.  These Care Teams include your primary Cardiologist (physician) and Advanced Practice Providers (APPs -  Physician Assistants and Nurse Practitioners) who all work together to provide you with the care you need, when you need it. Your next appointment:   12 month(s) The format for your next appointment:   In Person Provider:   You may see Philip Nahser, MD or one of the following Advanced Practice Providers on your designated Care Team:    Scott Weaver, PA-C  Vin Bhagat, PA-C   

## 2019-06-15 DIAGNOSIS — E113393 Type 2 diabetes mellitus with moderate nonproliferative diabetic retinopathy without macular edema, bilateral: Secondary | ICD-10-CM | POA: Diagnosis not present

## 2019-11-08 DIAGNOSIS — K219 Gastro-esophageal reflux disease without esophagitis: Secondary | ICD-10-CM | POA: Diagnosis not present

## 2019-11-08 DIAGNOSIS — E119 Type 2 diabetes mellitus without complications: Secondary | ICD-10-CM | POA: Diagnosis not present

## 2019-11-08 DIAGNOSIS — I10 Essential (primary) hypertension: Secondary | ICD-10-CM | POA: Diagnosis not present

## 2019-11-08 DIAGNOSIS — E7800 Pure hypercholesterolemia, unspecified: Secondary | ICD-10-CM | POA: Diagnosis not present

## 2019-12-13 DIAGNOSIS — E113393 Type 2 diabetes mellitus with moderate nonproliferative diabetic retinopathy without macular edema, bilateral: Secondary | ICD-10-CM | POA: Diagnosis not present

## 2020-05-08 DIAGNOSIS — I10 Essential (primary) hypertension: Secondary | ICD-10-CM | POA: Diagnosis not present

## 2020-05-08 DIAGNOSIS — E119 Type 2 diabetes mellitus without complications: Secondary | ICD-10-CM | POA: Diagnosis not present

## 2020-05-08 DIAGNOSIS — Z125 Encounter for screening for malignant neoplasm of prostate: Secondary | ICD-10-CM | POA: Diagnosis not present

## 2020-05-08 DIAGNOSIS — Z0000 Encounter for general adult medical examination without abnormal findings: Secondary | ICD-10-CM | POA: Diagnosis not present

## 2020-05-08 DIAGNOSIS — E7800 Pure hypercholesterolemia, unspecified: Secondary | ICD-10-CM | POA: Diagnosis not present

## 2020-05-08 DIAGNOSIS — F419 Anxiety disorder, unspecified: Secondary | ICD-10-CM | POA: Diagnosis not present

## 2020-06-26 DIAGNOSIS — E113393 Type 2 diabetes mellitus with moderate nonproliferative diabetic retinopathy without macular edema, bilateral: Secondary | ICD-10-CM | POA: Diagnosis not present

## 2020-11-06 DIAGNOSIS — I10 Essential (primary) hypertension: Secondary | ICD-10-CM | POA: Diagnosis not present

## 2020-11-06 DIAGNOSIS — F419 Anxiety disorder, unspecified: Secondary | ICD-10-CM | POA: Diagnosis not present

## 2020-11-06 DIAGNOSIS — E119 Type 2 diabetes mellitus without complications: Secondary | ICD-10-CM | POA: Diagnosis not present

## 2020-11-06 DIAGNOSIS — E7800 Pure hypercholesterolemia, unspecified: Secondary | ICD-10-CM | POA: Diagnosis not present

## 2020-12-25 DIAGNOSIS — E113393 Type 2 diabetes mellitus with moderate nonproliferative diabetic retinopathy without macular edema, bilateral: Secondary | ICD-10-CM | POA: Diagnosis not present

## 2021-04-16 DIAGNOSIS — M5416 Radiculopathy, lumbar region: Secondary | ICD-10-CM | POA: Diagnosis not present

## 2021-04-16 DIAGNOSIS — M48061 Spinal stenosis, lumbar region without neurogenic claudication: Secondary | ICD-10-CM | POA: Diagnosis not present

## 2021-04-18 DIAGNOSIS — M4726 Other spondylosis with radiculopathy, lumbar region: Secondary | ICD-10-CM | POA: Diagnosis not present

## 2021-04-23 DIAGNOSIS — M5459 Other low back pain: Secondary | ICD-10-CM | POA: Diagnosis not present

## 2021-04-23 DIAGNOSIS — M25552 Pain in left hip: Secondary | ICD-10-CM | POA: Diagnosis not present

## 2021-04-24 DIAGNOSIS — M5459 Other low back pain: Secondary | ICD-10-CM | POA: Diagnosis not present

## 2021-04-25 DIAGNOSIS — M4726 Other spondylosis with radiculopathy, lumbar region: Secondary | ICD-10-CM | POA: Diagnosis not present

## 2021-04-27 DIAGNOSIS — M4726 Other spondylosis with radiculopathy, lumbar region: Secondary | ICD-10-CM | POA: Diagnosis not present

## 2021-04-30 DIAGNOSIS — E113393 Type 2 diabetes mellitus with moderate nonproliferative diabetic retinopathy without macular edema, bilateral: Secondary | ICD-10-CM | POA: Diagnosis not present

## 2021-05-02 DIAGNOSIS — M4726 Other spondylosis with radiculopathy, lumbar region: Secondary | ICD-10-CM | POA: Diagnosis not present

## 2021-05-04 DIAGNOSIS — M4726 Other spondylosis with radiculopathy, lumbar region: Secondary | ICD-10-CM | POA: Diagnosis not present

## 2021-05-09 DIAGNOSIS — M4726 Other spondylosis with radiculopathy, lumbar region: Secondary | ICD-10-CM | POA: Diagnosis not present

## 2021-05-10 DIAGNOSIS — M4726 Other spondylosis with radiculopathy, lumbar region: Secondary | ICD-10-CM | POA: Diagnosis not present

## 2021-05-14 DIAGNOSIS — M4726 Other spondylosis with radiculopathy, lumbar region: Secondary | ICD-10-CM | POA: Diagnosis not present

## 2021-05-16 DIAGNOSIS — M4726 Other spondylosis with radiculopathy, lumbar region: Secondary | ICD-10-CM | POA: Diagnosis not present

## 2021-05-21 DIAGNOSIS — Z125 Encounter for screening for malignant neoplasm of prostate: Secondary | ICD-10-CM | POA: Diagnosis not present

## 2021-05-21 DIAGNOSIS — K219 Gastro-esophageal reflux disease without esophagitis: Secondary | ICD-10-CM | POA: Diagnosis not present

## 2021-05-21 DIAGNOSIS — Z0000 Encounter for general adult medical examination without abnormal findings: Secondary | ICD-10-CM | POA: Diagnosis not present

## 2021-05-21 DIAGNOSIS — E7800 Pure hypercholesterolemia, unspecified: Secondary | ICD-10-CM | POA: Diagnosis not present

## 2021-05-21 DIAGNOSIS — E119 Type 2 diabetes mellitus without complications: Secondary | ICD-10-CM | POA: Diagnosis not present

## 2021-05-21 DIAGNOSIS — I10 Essential (primary) hypertension: Secondary | ICD-10-CM | POA: Diagnosis not present

## 2021-05-23 DIAGNOSIS — M4726 Other spondylosis with radiculopathy, lumbar region: Secondary | ICD-10-CM | POA: Diagnosis not present

## 2021-05-24 DIAGNOSIS — M4726 Other spondylosis with radiculopathy, lumbar region: Secondary | ICD-10-CM | POA: Diagnosis not present

## 2021-05-28 DIAGNOSIS — M4726 Other spondylosis with radiculopathy, lumbar region: Secondary | ICD-10-CM | POA: Diagnosis not present

## 2021-05-30 DIAGNOSIS — M25562 Pain in left knee: Secondary | ICD-10-CM | POA: Diagnosis not present

## 2021-05-30 DIAGNOSIS — M25552 Pain in left hip: Secondary | ICD-10-CM | POA: Diagnosis not present

## 2021-05-30 DIAGNOSIS — M4726 Other spondylosis with radiculopathy, lumbar region: Secondary | ICD-10-CM | POA: Diagnosis not present

## 2021-06-04 DIAGNOSIS — M4726 Other spondylosis with radiculopathy, lumbar region: Secondary | ICD-10-CM | POA: Diagnosis not present

## 2021-06-06 DIAGNOSIS — M4726 Other spondylosis with radiculopathy, lumbar region: Secondary | ICD-10-CM | POA: Diagnosis not present

## 2021-06-11 DIAGNOSIS — M4726 Other spondylosis with radiculopathy, lumbar region: Secondary | ICD-10-CM | POA: Diagnosis not present

## 2021-06-13 DIAGNOSIS — M4726 Other spondylosis with radiculopathy, lumbar region: Secondary | ICD-10-CM | POA: Diagnosis not present

## 2021-06-18 DIAGNOSIS — M4726 Other spondylosis with radiculopathy, lumbar region: Secondary | ICD-10-CM | POA: Diagnosis not present

## 2021-06-20 DIAGNOSIS — M4726 Other spondylosis with radiculopathy, lumbar region: Secondary | ICD-10-CM | POA: Diagnosis not present

## 2021-06-24 ENCOUNTER — Encounter: Payer: Self-pay | Admitting: Cardiovascular Disease

## 2021-06-24 NOTE — Progress Notes (Signed)
This encounter was created in error - please disregard.

## 2021-06-25 ENCOUNTER — Encounter: Payer: BC Managed Care – PPO | Admitting: Cardiovascular Disease

## 2021-06-25 DIAGNOSIS — M4726 Other spondylosis with radiculopathy, lumbar region: Secondary | ICD-10-CM | POA: Diagnosis not present

## 2021-06-26 ENCOUNTER — Ambulatory Visit: Payer: BC Managed Care – PPO | Admitting: Cardiovascular Disease

## 2021-06-26 ENCOUNTER — Encounter: Payer: Self-pay | Admitting: Cardiovascular Disease

## 2021-06-26 VITALS — BP 142/70 | HR 73 | Ht 65.0 in | Wt 168.0 lb

## 2021-06-26 DIAGNOSIS — I10 Essential (primary) hypertension: Secondary | ICD-10-CM | POA: Diagnosis not present

## 2021-06-26 MED ORDER — NEBIVOLOL HCL 5 MG PO TABS: 5.0000 mg | ORAL_TABLET | Freq: Every day | ORAL | 3 refills | Status: DC

## 2021-06-26 NOTE — Patient Instructions (Signed)
Medication Instructions:  ?Your physician has recommended you make the following change in your medication: ? ?1) START Nebivolol (Bystolic) 5mg  once daily ? ?*If you need a refill on your cardiac medications before your next appointment, please call your pharmacy* ? ?Lab Work: ?NONE ? ?Testing/Procedures: ?NONE ? ?Follow-Up: ?At [facility], you and your health needs are our priority.  As part of our continuing mission to provide you with exceptional heart care, we have created designated Provider Care Teams.  These Care Teams include your primary Cardiologist (physician) and Advanced Practice Providers (APPs -  Physician Assistants and Nurse Practitioners) who all work together to provide you with the care you need, when you need it. ? ?We recommend signing up for the patient portal called "MyChart".  Sign up information is provided on this After Visit Summary.  MyChart is used to connect with patients for Virtual Visits (Telemedicine).  Patients are able to view lab/test results, encounter notes, upcoming appointments, etc.  Non-urgent messages can be sent to your provider as well.   ?To learn more about what you can do with MyChart, go to [email or web address].   ? ?Your next appointment:   ?1 year(s) ? ?The format for your next appointment:   ?In Person ? ?Provider:   ?Mertie Moores, MD { ? ?Other Instructions ?Important Information About Sugar ? ? ? ? ?  ?

## 2021-06-26 NOTE — Progress Notes (Signed)
? ?Cardiology Office Note ? ? ?Date:  06/26/2021  ? ?ID:  Greg Livingston, DOB March 10, 1956, MRN [id number] ? ?PCP:  Johny Blamer, MD  ?Cardiologist:   [name], MD  ? ?Chief Complaint  ?Patient presents with  ? Hypertension  ?   ?  ? Palpitations  ? ?Problem List: ?1. HTN ?2. diabetes mellitus ?3. Left ventricular hypertrophy ?4. Diastolic dysfunction-by an echocardiogram performed in [place], [place]. ? ?Previous notes:  ? ?Greg Livingston  Is a 66 yo from [place].  Presents for further evaluation of HTN.    ?He has had a variable BP .  He was this increase several weeks ago and his blood pressure was as high as 210/110. ?He has been keeping a BP log. ? ? ?His mother has history of diabetes and high blood pressure. 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  ? ?October 05, 2013: ?Greg Livingston is doing well.  BP is better.  We started him in HCTZ at his last visit. ?  ?May 16, 2014: ? ?Greg Livingston is a 66 y.o. male who presents for follow-up of his hypertension. His blood pressure has been well controlled. He still exercises on a regular basis. ? ?He is feeling great.   ? ?May 15, 2015: ?Greg Livingston is seen back today for a 1 year visit.   ?Hx of HTN .   ?No CP or dyspnea.    ? ?May 09, 2016: ? ?Greg Livingston is seen today for follow up of his HTN  ?BP is good today  ?BP has been a little elevated after he ran out of his HCTZ   ?Still eating some salt . He has been out of the Bystolic for a month ? ?February 25, 2017: ? ?Here to evaluate chest tightness with deep breath ?For the past month or so,  On and off.   ?Is not associated with exertion ?Not sharp.   Is a full sensation.  No knife like or burning.  ?Not associated with twising or turning his torso. ?No fever, no cough or cold .  ?Goes to chiropractor twice a week.    ?Has tried some ASA 81 mg once a day .  ? ?No leg swelling , no dyspnea. .      ? ?April 21, 2017: ? Greg Livingston was seen in December.  He was  having some pleuritic chest pain at that time.  He was leaving for [place] later that afternoon to attend his mother's funeral.     Was not short of breath , not tachycardia ?We Ordered a stress Myoview study and echocardiogram. ?If the echocardiogram reveals normal left ventricular systolic function.  He has grade 1 diastolic dysfunction. ?GXT was normal. ? ?Rarely , still has some pleuretic CP,  Not nearly as bad as before  ?abot to walk with any worsening CP  ? ?May 19, 2019:  ?Greg Livingston is seen today for follow-up visit.  He has a history of chest pain in the past.  Echocardiogram in the past is revealed normal left ventricular systolic function with grade 1 diastolic dysfunction.  He had a normal stress test. ? 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 ?June 26, 2021 ?Greg Livingston is seen today for follow up of his HTN, HLD ?Has grade 1 DD ?Owns [employer] in [place] . 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  ? ? ? ?  Past Medical History:  ?Diagnosis Date  ? Anxiety state, unspecified   ? Diabetes mellitus without complication (HCC)   ? Esophageal reflux   ? First degree AV block   ? Hypertension   ? Impotence of organic origin   ? Osteoarthrosis, unspecified whether generalized or localized, other specified sites   ? Other and unspecified hyperlipidemia   ? Other atopic dermatitis and related conditions   ? Pain in joint, pelvic region and thigh   ? Pure hypercholesterolemia   ? ? ?Past Surgical History:  ?Procedure Laterality Date  ? PARTIAL HIP ARTHROPLASTY    ? ? ? ?Current Outpatient Medications  ?Medication Sig Dispense Refill  ? ALPRAZolam (XANAX) 0.5 MG tablet Take 5 mg by mouth as needed.    ? aspirin 81 MG tablet Take 81 mg by mouth daily.    ? glipiZIDE-metformin (METAGLIP) 2.5-500 MG per tablet Take 2 tablets by mouth 2 (two) times daily before a meal.    ? hydrochlorothiazide (HYDRODIURIL) 25 MG tablet  Take 1 tablet (25 mg total) by mouth daily. 30 tablet 0  ? losartan (COZAAR) 100 MG tablet Take 100 mg by mouth daily.    ? potassium chloride (K-DUR) 10 MEQ tablet Take 1 tablet (10 mEq total) by mouth daily. 90 tablet 3  ? tadalafil (CIALIS) 20 MG tablet Take 20 mg by mouth as needed.    ? ?Current Facility-Administered Medications  ?Medication Dose Route Frequency Provider Last Rate Last Admin  ? betamethasone acetate-betamethasone sodium phosphate (CELESTONE) injection 3 mg  3 mg Intramuscular Once Felecia Shelling, DPM      ? betamethasone acetate-betamethasone sodium phosphate (CELESTONE) injection 3 mg  3 mg Intramuscular Once Gala Lewandowsky M, DPM      ? betamethasone acetate-betamethasone sodium phosphate (CELESTONE) injection 3 mg  3 mg Intramuscular Once Felecia Shelling, DPM      ? ? ?Allergies:   Patient has no known allergies.  ? ? ?Social History:  The patient  reports that he has never smoked. He has never used smokeless tobacco. He reports that he does not drink alcohol and does not use drugs.  ? ?Family History:  The patient's family history includes Diabetes in his mother.  ? ? ?ROS: Noted in the current history.  All other systems are negative. ? ? ?Physical Exam: ?Blood pressure (!) 142/70, pulse 73, height 5\' 5"  (1.651 m), weight 168 lb (76.2 kg), SpO2 97 %. ? ?GEN:  middle age man,  no acute distress ?HEENT: Normal ?NECK: No JVD; No carotid bruits ?LYMPHATICS: No lymphadenopathy ?CARDIAC: RRR  ?RESPIRATORY:  Clear to auscultation without rales, wheezing or rhonchi  ?ABDOMEN: Soft, non-tender, non-distended ?MUSCULOSKELETAL:  No edema; No deformity  ?SKIN: Warm and dry ?NEUROLOGIC:  Alert and oriented x 3 ? ? ? ? ?EKG:   June 26, 2021: Normal sinus rhythm at 73.  First-degree AV block.  Otherwise normal EKG. ? ?Recent Labs: ?No results found for requested labs within last 8760 hours.  ? ?Lipid Panel ?   ?Component Value Date/Time  ? CHOL 112 05/09/2016 1230  ? TRIG 95 05/09/2016 1230  ? HDL 38 (L)  05/09/2016 1230  ? CHOLHDL 2.9 05/09/2016 1230  ? LDLCALC 55 05/09/2016 1230  ? ?  ? ?Wt Readings from Last 3 Encounters:  ?06/26/21 168 lb (76.2 kg)  ?05/19/19 173 lb (78.5 kg)  ?04/21/17 182 lb 12.8 oz (82.9 kg)  ?  ? ? ?Other studies Reviewed: ?Additional studies/ records that were reviewed today  include: . ?Review of the above records demonstrates:  ? ? ?ASSESSMENT AND PLAN: ? ?1. Chest pain :    He is not having any further episodes of chest discomfort. ? ?2.  Premature ventricular contractions:   PVCs seem to be stable. ?  ? ?2. HTN -   blood pressure is mildly elevated.  He occasionally will get a systolic blood pressure in the 160s.  He was previously on Bystolic but somehow the medication fell off his list.  We will restart Bystolic 5 mg a day.  I encouraged him to watch his salt intake.  Continue to exercise on a regular basis. ? ?3. Left ventricular hypertrophy-   ? ?4. Diastolic dysfunction-  stable  ?  ? ?Current medicines are reviewed at length with the patient today.  The patient does not have concerns regarding medicines. ? ?The following changes have been made:  no change ? ? ?Disposition:   FU with me in one year.  ? ? ?Signed, ?Kristeen MissPhilip Shonte Soderlund, MD  ?06/26/2021 3:27 PM    ?[facility] ?[address] [place], [place], [place]  [address] ?Phone: [phone]; Fax: [phone]  ?

## 2021-06-27 DIAGNOSIS — M4726 Other spondylosis with radiculopathy, lumbar region: Secondary | ICD-10-CM | POA: Diagnosis not present

## 2021-07-02 DIAGNOSIS — M4726 Other spondylosis with radiculopathy, lumbar region: Secondary | ICD-10-CM | POA: Diagnosis not present

## 2021-07-04 DIAGNOSIS — M4726 Other spondylosis with radiculopathy, lumbar region: Secondary | ICD-10-CM | POA: Diagnosis not present

## 2021-07-05 ENCOUNTER — Telehealth: Payer: Self-pay

## 2021-07-05 NOTE — Telephone Encounter (Signed)
**Note De-Identified Greg Livingston Obfuscation** Nebivolol PA started through covermymeds. ?Key: [id number] ?

## 2021-07-09 DIAGNOSIS — M4726 Other spondylosis with radiculopathy, lumbar region: Secondary | ICD-10-CM | POA: Diagnosis not present

## 2021-07-11 DIAGNOSIS — M4726 Other spondylosis with radiculopathy, lumbar region: Secondary | ICD-10-CM | POA: Diagnosis not present

## 2021-07-17 DIAGNOSIS — M4726 Other spondylosis with radiculopathy, lumbar region: Secondary | ICD-10-CM | POA: Diagnosis not present

## 2021-07-18 NOTE — Telephone Encounter (Signed)
Attempted to contact patient to discuss Dr. Harvie Bridge recommendation. No answer, left message to return call. ?

## 2021-07-18 NOTE — Telephone Encounter (Signed)
**Note De-Identified Trai Ells Obfuscation** [employer] has denied coverage of Nebivolol. ?Reason for denial: ?The pt must first try and fail all formulary alternatives which include: ?Atenolol, Bisoprolol, Metoprolol, etc. ? ?Forwarding to Dr Elease Hashimoto and his nurse for advisement to the pt. ?

## 2021-07-19 DIAGNOSIS — M4726 Other spondylosis with radiculopathy, lumbar region: Secondary | ICD-10-CM | POA: Diagnosis not present

## 2021-07-19 MED ORDER — CARVEDILOL 6.25 MG PO TABS: 6.2500 mg | ORAL_TABLET | Freq: Two times a day (BID) | ORAL | 3 refills | Status: DC

## 2021-07-19 NOTE — Telephone Encounter (Signed)
Nahser, Deloris Ping, MD sent to [facility] Triage ?Caller: Unspecified (2 weeks ago) ?Lets have him try  ?Coreg 6. 25 BID to see if this helps with his palpitations and keeps BP controlled.  ?PN  ? ?Called and spoke to wife Trinna Post on [place] to review change from Bystolic to Coreg. Medication sent to pharmacy at this time. ?

## 2021-07-23 DIAGNOSIS — M4726 Other spondylosis with radiculopathy, lumbar region: Secondary | ICD-10-CM | POA: Diagnosis not present

## 2021-07-25 DIAGNOSIS — M4726 Other spondylosis with radiculopathy, lumbar region: Secondary | ICD-10-CM | POA: Diagnosis not present

## 2021-07-30 DIAGNOSIS — M4726 Other spondylosis with radiculopathy, lumbar region: Secondary | ICD-10-CM | POA: Diagnosis not present

## 2021-08-01 DIAGNOSIS — M4726 Other spondylosis with radiculopathy, lumbar region: Secondary | ICD-10-CM | POA: Diagnosis not present

## 2021-08-08 DIAGNOSIS — M4726 Other spondylosis with radiculopathy, lumbar region: Secondary | ICD-10-CM | POA: Diagnosis not present

## 2021-10-15 DIAGNOSIS — E113393 Type 2 diabetes mellitus with moderate nonproliferative diabetic retinopathy without macular edema, bilateral: Secondary | ICD-10-CM | POA: Diagnosis not present

## 2021-11-26 DIAGNOSIS — I10 Essential (primary) hypertension: Secondary | ICD-10-CM | POA: Diagnosis not present

## 2021-11-26 DIAGNOSIS — E119 Type 2 diabetes mellitus without complications: Secondary | ICD-10-CM | POA: Diagnosis not present

## 2021-11-26 DIAGNOSIS — N5201 Erectile dysfunction due to arterial insufficiency: Secondary | ICD-10-CM | POA: Diagnosis not present

## 2021-11-26 DIAGNOSIS — E7800 Pure hypercholesterolemia, unspecified: Secondary | ICD-10-CM | POA: Diagnosis not present

## 2022-02-18 DIAGNOSIS — M5442 Lumbago with sciatica, left side: Secondary | ICD-10-CM | POA: Diagnosis not present

## 2022-02-18 DIAGNOSIS — G8929 Other chronic pain: Secondary | ICD-10-CM | POA: Diagnosis not present

## 2022-02-18 DIAGNOSIS — M25562 Pain in left knee: Secondary | ICD-10-CM | POA: Diagnosis not present

## 2022-02-18 DIAGNOSIS — M5136 Other intervertebral disc degeneration, lumbar region: Secondary | ICD-10-CM | POA: Diagnosis not present

## 2022-02-18 DIAGNOSIS — M47816 Spondylosis without myelopathy or radiculopathy, lumbar region: Secondary | ICD-10-CM | POA: Diagnosis not present

## 2022-02-19 ENCOUNTER — Other Ambulatory Visit: Payer: Self-pay | Admitting: Sports Medicine

## 2022-02-19 DIAGNOSIS — R29898 Other symptoms and signs involving the musculoskeletal system: Secondary | ICD-10-CM

## 2022-02-19 DIAGNOSIS — G8929 Other chronic pain: Secondary | ICD-10-CM

## 2022-02-25 ENCOUNTER — Ambulatory Visit: Admission: RE | Admit: 2022-02-25 | Payer: Medicare Other | Source: Ambulatory Visit

## 2022-02-27 ENCOUNTER — Ambulatory Visit: Payer: BC Managed Care – PPO

## 2022-03-18 ENCOUNTER — Ambulatory Visit
Admission: RE | Admit: 2022-03-18 | Discharge: 2022-03-18 | Disposition: A | Discharge status: Home / Self Care | Payer: Medicare Other | Source: Ambulatory Visit | Attending: Sports Medicine | Admitting: Sports Medicine

## 2022-03-18 DIAGNOSIS — M48061 Spinal stenosis, lumbar region without neurogenic claudication: Secondary | ICD-10-CM | POA: Diagnosis not present

## 2022-03-18 DIAGNOSIS — M5442 Lumbago with sciatica, left side: Secondary | ICD-10-CM | POA: Insufficient documentation

## 2022-03-18 DIAGNOSIS — G8929 Other chronic pain: Secondary | ICD-10-CM | POA: Diagnosis not present

## 2022-03-18 DIAGNOSIS — R29898 Other symptoms and signs involving the musculoskeletal system: Secondary | ICD-10-CM | POA: Diagnosis not present

## 2022-04-03 DIAGNOSIS — K080 Exfoliation of teeth due to systemic causes: Secondary | ICD-10-CM | POA: Diagnosis not present

## 2022-04-22 DIAGNOSIS — E113393 Type 2 diabetes mellitus with moderate nonproliferative diabetic retinopathy without macular edema, bilateral: Secondary | ICD-10-CM | POA: Diagnosis not present

## 2022-04-22 DIAGNOSIS — H524 Presbyopia: Secondary | ICD-10-CM | POA: Diagnosis not present

## 2022-05-20 ENCOUNTER — Other Ambulatory Visit: Payer: Self-pay | Admitting: *Deleted

## 2022-05-20 MED ORDER — CARVEDILOL 6.25 MG PO TABS: 6.2500 mg | ORAL_TABLET | Freq: Two times a day (BID) | ORAL | 0 refills | Status: DC

## 2022-06-06 DIAGNOSIS — Z0000 Encounter for general adult medical examination without abnormal findings: Secondary | ICD-10-CM | POA: Diagnosis not present

## 2022-06-06 DIAGNOSIS — E7800 Pure hypercholesterolemia, unspecified: Secondary | ICD-10-CM | POA: Diagnosis not present

## 2022-06-06 DIAGNOSIS — E119 Type 2 diabetes mellitus without complications: Secondary | ICD-10-CM | POA: Diagnosis not present

## 2022-06-06 DIAGNOSIS — I10 Essential (primary) hypertension: Secondary | ICD-10-CM | POA: Diagnosis not present

## 2022-06-06 DIAGNOSIS — F419 Anxiety disorder, unspecified: Secondary | ICD-10-CM | POA: Diagnosis not present

## 2022-06-06 DIAGNOSIS — Z125 Encounter for screening for malignant neoplasm of prostate: Secondary | ICD-10-CM | POA: Diagnosis not present

## 2022-09-01 ENCOUNTER — Telehealth (HOSPITAL_BASED_OUTPATIENT_CLINIC_OR_DEPARTMENT_OTHER): Payer: Self-pay

## 2022-09-01 MED ORDER — NEBIVOLOL HCL 5 MG PO TABS: 5.0000 mg | ORAL_TABLET | Freq: Every day | ORAL | 1 refills | Status: DC

## 2022-09-01 NOTE — Telephone Encounter (Signed)
Patient's wife (DPR on file) requested refill of Nebivolol prior to patient leaving for [place] tomorrow on June 24 for 3 weeks.  Chart reviewed, pt prescribed carvedilol 6.25 mg when insurance denied coverage for Nebivolol (Bystolic).  Call to patient's wife to clarify which medication patient has been taking.  Patient's wife states patient has been taking the Nebivolol and had refills until recently. Patient would like to continue taking Nebivolol and pay out of pocket.  He is requesting a refill before he leaves for [place] tomorrow.  Pharmacy is [facility] on [address] in [place] ph # [phone].  Call to pharmacist who states pt. Has been taking Nebivolol 5 mg daily thru [facility].  They state refill for Nebivolol received from Dr. Harvie Bridge office in March 2024. Patient wishes to stay on Nebivolol. Clinical research associate conferred with [employer].  Prescription (as per Dr. Harvie Bridge last OV note) sent for Nebivolol 5mg  daily #30 refill x1 and reminder to keep July appt. Patient's wife notified as requested per text.  Jim Like MHA RN CCM

## 2022-09-02 ENCOUNTER — Other Ambulatory Visit: Payer: Self-pay

## 2022-09-02 MED ORDER — NEBIVOLOL HCL 5 MG PO TABS: 5.0000 mg | ORAL_TABLET | Freq: Every day | ORAL | 0 refills | Status: DC

## 2022-09-30 ENCOUNTER — Encounter: Payer: Self-pay | Admitting: Cardiovascular Disease

## 2022-09-30 NOTE — Progress Notes (Unsigned)
Cardiology Office Note   Date:  10/01/2022   ID:  Greg Livingston, DOB 10/15/1955, MRN [id number]  PCP:  Noberto Retort, MD  Cardiologist:   [name], MD   Chief Complaint  Patient presents with   Hypertension        Problem List: 1. HTN 2. diabetes mellitus 3. Left ventricular hypertrophy 4. Diastolic dysfunction-by an echocardiogram performed in [place], [place].  Previous notes:   Greg Livingston  Is a 67 yo from [place].  Presents for further evaluation of HTN.    He has had a variable BP .  He was this increase several weeks ago and his blood pressure was as high as 210/110. He has been keeping a BP log.   His mother has history of diabetes and high blood pressure. 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  [place]3 [place] each day approximately 3 days a week.   October 05, 2013: Greg Livingston is doing well.  BP is better.  We started him in HCTZ at his last visit.   May 16, 2014:  Greg Livingston is a 67 y.o. male who presents for follow-up of his hypertension. His blood pressure has been well controlled. He still exercises on a regular basis.  He is feeling great.    May 15, 2015: Greg Livingston is seen back today for a 1 year visit.   Hx of HTN .   No CP or dyspnea.     May 09, 2016:  Greg Livingston is seen today for follow up of his HTN  BP is good today  BP has been a little elevated after he ran out of his HCTZ   Still eating some salt . He has been out of the Bystolic for a month  February 25, 2017:  Here to evaluate chest tightness with deep breath For the past month or so,  On and off.   Is not associated with exertion Not sharp.   Is a full sensation.  No knife like or burning.  Not associated with twising or turning his torso. No fever, no cough or cold .  Goes to chiropractor twice a week.    Has tried some ASA 81 mg once a day .   No leg swelling , no dyspnea. .       April 21, 2017:  Greg Livingston was seen in December.  He was having some  pleuritic chest pain at that time.  He was leaving for [place] later that afternoon to attend his mother's funeral.     Was not short of breath , not tachycardia We Ordered a stress Myoview study and echocardiogram. If the echocardiogram reveals normal left ventricular systolic function.  He has grade 1 diastolic dysfunction. GXT was normal.  Rarely , still has some pleuretic CP,  Not nearly as bad as before  abot to walk with any worsening CP   May 19, 2019:  Greg Livingston is seen today for follow-up visit.  He has a history of chest pain in the past.  Echocardiogram in the past is revealed normal left ventricular systolic function with grade 1 diastolic dysfunction.  He had a normal stress test.  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  October 01, 2022 Greg Livingston is seen for follow up of his HTN, HLD. He feels great . Doing well at [facility]  [employer]  Still cannot find enough help .   No CP ,  no dyspnea       Past Medical History:  Diagnosis Date   Anxiety state, unspecified    Diabetes mellitus without complication (HCC)    Esophageal reflux    First degree AV block    Hypertension    Impotence of organic origin    Osteoarthrosis, unspecified whether generalized or localized, other specified sites    Other and unspecified hyperlipidemia    Other atopic dermatitis and related conditions    Pain in joint, pelvic region and thigh    Pure hypercholesterolemia     Past Surgical History:  Procedure Laterality Date   PARTIAL HIP ARTHROPLASTY       Current Outpatient Medications  Medication Sig Dispense Refill   ALPRAZolam (XANAX) 0.5 MG tablet Take 5 mg by mouth as needed.     aspirin 81 MG tablet Take 81 mg by mouth  daily.     glipiZIDE-metformin (METAGLIP) 2.5-500 MG per tablet Take 2 tablets by mouth 2 (two) times daily before a meal.     tadalafil (CIALIS) 20 MG tablet Take 20 mg by mouth as needed.     hydrochlorothiazide (HYDRODIURIL) 25 MG tablet Take 1 tablet (25 mg total) by mouth daily. 90 tablet 3   losartan (COZAAR) 100 MG tablet Take 1 tablet (100 mg total) by mouth daily. 90 tablet 3   nebivolol (BYSTOLIC) 5 MG tablet Take 1 tablet (5 mg total) by mouth daily. 90 tablet 3   potassium chloride (KLOR-CON) 10 MEQ tablet Take 1 tablet (10 mEq total) by mouth daily. 90 tablet 3   Current Facility-Administered Medications  Medication Dose Route Frequency Provider Last Rate Last Admin   betamethasone acetate-betamethasone sodium phosphate (CELESTONE) injection 3 mg  3 mg Intramuscular Once Gala Lewandowsky M, DPM       betamethasone acetate-betamethasone sodium phosphate (CELESTONE) injection 3 mg  3 mg Intramuscular Once Gala Lewandowsky M, DPM       betamethasone acetate-betamethasone sodium phosphate (CELESTONE) injection 3 mg  3 mg Intramuscular Once Felecia Shelling, DPM        Allergies:   Patient has no known allergies.    Social History:  The patient  reports that he has never smoked. He has never used smokeless tobacco. He reports that he does not drink alcohol and does not use drugs.   Family History:  The patient's family history includes Diabetes in his mother.    ROS: Noted in the current history.  All other systems are negative.   Physical Exam: Blood pressure 120/64, pulse 62, height 5\' 5"  (1.651 m), weight 177 lb 12.8 oz (80.6 kg), SpO2 95%.       GEN:  Well nourished, well developed in no acute distress HEENT: Normal NECK: No JVD; No carotid bruits LYMPHATICS: No lymphadenopathy CARDIAC: RRR , no murmurs, rubs, gallops RESPIRATORY:  Clear to auscultation without rales, wheezing or rhonchi  ABDOMEN: Soft, non-tender, non-distended MUSCULOSKELETAL:  No edema; No deformity   SKIN: Warm and dry NEUROLOGIC:  Alert and oriented x 3     EKG:    EKG Interpretation Date/Time:  Tuesday October 01 2022 14:19:05 EDT Ventricular Rate:  62 PR Interval:  276 QRS Duration:  96 QT Interval:  404 QTC Calculation: 410 R Axis:   -2  Text Interpretation:  Sinus rhythm with 1st degree A-V block When compared with ECG of 21-Sep-2007 14:11, Incomplete right bundle branch block is no longer Present Confirmed by [name] ([id number]) on 10/01/2022 2:38:37 PM    Recent Labs: No results found for requested labs within last 365 days.   Lipid Panel    Component Value Date/Time   CHOL 112 05/09/2016 1230   TRIG 95 05/09/2016 1230   HDL 38 (L) 05/09/2016 1230   CHOLHDL 2.9 05/09/2016 1230   LDLCALC 55 05/09/2016 1230      Wt Readings from Last 3 Encounters:  10/01/22 177 lb 12.8 oz (80.6 kg)  06/26/21 168 lb (76.2 kg)  05/19/19 173 lb (78.5 kg)      Other studies Reviewed: Additional studies/ records that were reviewed today include: . Review of the above records demonstrates:    ASSESSMENT AND PLAN:  1. Chest pain :    no further episodes of CP   2.  Premature ventricular contractions:   stable     3. HTN -    BP is well controlled.   4. Left ventricular hypertrophy-    5. Diastolic dysfunction-   no dysnea     Current medicines are reviewed at length with the patient today.  The patient does not have concerns regarding medicines.  The following changes have been made:  no change   Disposition:   FU with me in one year.    Signed, [name], MD  10/01/2022 2:38 PM    [facility] [address] [place], [place], [place]  [address] Phone: [phone]; Fax: [phone]

## 2022-10-01 ENCOUNTER — Ambulatory Visit: Payer: Medicare Other | Admitting: Cardiovascular Disease

## 2022-10-01 ENCOUNTER — Encounter: Payer: Self-pay | Admitting: Cardiovascular Disease

## 2022-10-01 VITALS — BP 120/64 | HR 62 | Ht 65.0 in | Wt 177.8 lb

## 2022-10-01 DIAGNOSIS — I5032 Chronic diastolic (congestive) heart failure: Secondary | ICD-10-CM

## 2022-10-01 DIAGNOSIS — I10 Essential (primary) hypertension: Secondary | ICD-10-CM | POA: Diagnosis not present

## 2022-10-01 MED ORDER — LOSARTAN POTASSIUM 100 MG PO TABS: 100.0000 mg | ORAL_TABLET | Freq: Every day | ORAL | 3 refills | Status: DC

## 2022-10-01 MED ORDER — NEBIVOLOL HCL 5 MG PO TABS: 5.0000 mg | ORAL_TABLET | Freq: Every day | ORAL | 3 refills | Status: DC

## 2022-10-01 MED ORDER — POTASSIUM CHLORIDE ER 10 MEQ PO TBCR: 10.0000 meq | EXTENDED_RELEASE_TABLET | Freq: Every day | ORAL | 3 refills | Status: DC

## 2022-10-01 MED ORDER — HYDROCHLOROTHIAZIDE 25 MG PO TABS: 25.0000 mg | ORAL_TABLET | Freq: Every day | ORAL | 3 refills | Status: DC

## 2022-10-01 NOTE — Patient Instructions (Signed)
Medication Instructions:  REFILLED cardiac medications *If you need a refill on your cardiac medications before your next appointment, please call your pharmacy*   Lab Work: NONE If you have labs (blood work) drawn today and your tests are completely normal, you will receive your results only by: MyChart Message (if you have MyChart) OR A paper copy in the mail If you have any lab test that is abnormal or we need to change your treatment, we will call you to review the results.   Testing/Procedures: NONE   Follow-Up: At [facility] you and your health needs are our priority.  As part of our continuing mission to provide you with exceptional heart care, we have created designated Provider Care Teams.  These Care Teams include your primary Cardiologist (physician) and Advanced Practice Providers (APPs -  Physician Assistants and Nurse Practitioners) who all work together to provide you with the care you need, when you need it.  We recommend signing up for the patient portal called "MyChart".  Sign up information is provided on this After Visit Summary.  MyChart is used to connect with patients for Virtual Visits (Telemedicine).  Patients are able to view lab/test results, encounter notes, upcoming appointments, etc.  Non-urgent messages can be sent to your provider as well.   To learn more about what you can do with MyChart, go to [email or web address].    Your next appointment:   1 year(s)  Provider:   [name], MD

## 2022-10-09 DIAGNOSIS — K080 Exfoliation of teeth due to systemic causes: Secondary | ICD-10-CM | POA: Diagnosis not present

## 2022-10-21 DIAGNOSIS — E113393 Type 2 diabetes mellitus with moderate nonproliferative diabetic retinopathy without macular edema, bilateral: Secondary | ICD-10-CM | POA: Diagnosis not present

## 2022-12-16 DIAGNOSIS — I10 Essential (primary) hypertension: Secondary | ICD-10-CM | POA: Diagnosis not present

## 2022-12-16 DIAGNOSIS — E7800 Pure hypercholesterolemia, unspecified: Secondary | ICD-10-CM | POA: Diagnosis not present

## 2022-12-16 DIAGNOSIS — E119 Type 2 diabetes mellitus without complications: Secondary | ICD-10-CM | POA: Diagnosis not present

## 2022-12-16 DIAGNOSIS — F419 Anxiety disorder, unspecified: Secondary | ICD-10-CM | POA: Diagnosis not present

## 2023-02-17 DIAGNOSIS — Z1211 Encounter for screening for malignant neoplasm of colon: Secondary | ICD-10-CM | POA: Diagnosis not present

## 2023-04-07 ENCOUNTER — Inpatient Hospital Stay: Payer: Medicare Other | Attending: Internal Medicine | Admitting: Internal Medicine

## 2023-04-07 ENCOUNTER — Encounter: Payer: Self-pay | Admitting: Internal Medicine

## 2023-04-07 ENCOUNTER — Inpatient Hospital Stay: Payer: Medicare Other

## 2023-04-07 VITALS — BP 127/60 | HR 66 | Temp 97.0°F | Resp 17 | Ht 65.0 in | Wt 177.6 lb

## 2023-04-07 DIAGNOSIS — R718 Other abnormality of red blood cells: Secondary | ICD-10-CM | POA: Insufficient documentation

## 2023-04-07 DIAGNOSIS — D649 Anemia, unspecified: Secondary | ICD-10-CM | POA: Diagnosis not present

## 2023-04-07 LAB — CBC WITH DIFFERENTIAL/PLATELET
Abs Immature Granulocytes: 0.03 10*3/uL (ref 0.00–0.07)
Basophils Absolute: 0 10*3/uL (ref 0.0–0.1)
Basophils Relative: 1 %
Eosinophils Absolute: 0.2 10*3/uL (ref 0.0–0.5)
Eosinophils Relative: 3 %
HCT: 45.3 % (ref 39.0–52.0)
Hemoglobin: 14.2 g/dL (ref 13.0–17.0)
Immature Granulocytes: 1 %
Lymphocytes Relative: 28 %
Lymphs Abs: 1.9 10*3/uL (ref 0.7–4.0)
MCH: 19.6 pg — ABNORMAL LOW (ref 26.0–34.0)
MCHC: 31.3 g/dL (ref 30.0–36.0)
MCV: 62.4 fL — ABNORMAL LOW (ref 80.0–100.0)
Monocytes Absolute: 0.7 10*3/uL (ref 0.1–1.0)
Monocytes Relative: 11 %
Neutro Abs: 3.7 10*3/uL (ref 1.7–7.7)
Neutrophils Relative %: 56 %
Platelets: 222 10*3/uL (ref 150–400)
RBC: 7.26 MIL/uL — ABNORMAL HIGH (ref 4.22–5.81)
RDW: 18.9 % — ABNORMAL HIGH (ref 11.5–15.5)
WBC: 6.6 10*3/uL (ref 4.0–10.5)
nRBC: 0 % (ref 0.0–0.2)

## 2023-04-07 LAB — TECHNOLOGIST SMEAR REVIEW: Plt Morphology: NORMAL

## 2023-04-07 NOTE — Progress Notes (Signed)
Has known about anemia for 3 months. Denies weakness or dyspnea. Has no visible blood in stools.Chronic back pain. Appetite is normal.

## 2023-04-07 NOTE — Assessment & Plan Note (Addendum)
#   January 25-hemoglobin 14 MCV 66 normal platelets.  Iron saturation 27; GFR 57- clinically suggestive-thalassemia especially given his Mediterranean origin.  And also family history-son also has anemia.  # No obvious evidence of iron deficiency.  I suspect as mentioned above thalassemia.  Order hemoglobinopathy evaluation for further workup and also a CBC.  # Diabetes hemoglobin A1c 7-recommend continued follow-up with PCP  # screening colonoscopy-patient never had a previous colonoscopy.  Defer to PCP.   Thank you Dr.Williams for allowing me to participate in the care of your pleasant patient. Please do not hesitate to contact me with questions or concerns in the interim.  Johnny- call with results- [phone] # DISPOSITION: # labs today- ordered # follow up TBD- Dr.B

## 2023-04-07 NOTE — Progress Notes (Signed)
[facility] CONSULT NOTE  Patient Care Team: Noberto Retort, MD as PCP - General (Family Medicine) Nahser, Deloris Ping, MD as PCP - Cardiology (Cardiology) Noberto Retort, MD (Family Medicine) Earna Coder, MD as Consulting Physician (Oncology)  CHIEF COMPLAINTS/PURPOSE OF CONSULTATION: ANEMIA   HEMATOLOGY HISTORY  # ANEMIA[Hb; MCV-platelets- WBC; Iron sat; ferritin;  GFR- CT/US- ;   HISTORY OF PRESENTING ILLNESS: Patient ambulating-independently. Accompanied by son.   Greg Livingston 68 y.o.  male pleasant patient is  been referred to [place] for further evaluation of anemia.  Patient known about anemia for 3 months. Denies weakness or dyspnea. Has no visible blood in stools.Chronic back pain. Appetite is normal   Patient complains of shortness of breath with exertion.  Also complains of excessive fatigue.  Complains of dizziness especially on standing.  Pica:-  Blood in stools: none EGD/colonoscopy- none  Blood in urine: none  Difficulty swallowing:none  Prior blood transfusion:none  Kidney/Liver disease:none  Alcohol: none  Bariatric surgery:none   Prior evaluation with hematology: none  Prior bone marrow biopsy: none  Oral iron: just started 1 week ago.  Prior IV iron infusions: none    Review of Systems  Constitutional:  Negative for chills, diaphoresis, fever, malaise/fatigue and weight loss.  HENT:  Negative for nosebleeds and sore throat.   Eyes:  Negative for double vision.  Respiratory:  Negative for cough, hemoptysis, sputum production, shortness of breath and wheezing.   Cardiovascular:  Negative for chest pain, palpitations, orthopnea and leg swelling.  Gastrointestinal:  Negative for abdominal pain, blood in stool, constipation, diarrhea, heartburn, melena, nausea and vomiting.  Genitourinary:  Negative for dysuria, frequency and urgency.  Musculoskeletal:  Negative for back pain and joint pain.  Skin: Negative.  Negative for  itching and rash.  Neurological:  Negative for dizziness, tingling, focal weakness, weakness and headaches.  Endo/Heme/Allergies:  Does not bruise/bleed easily.  Psychiatric/Behavioral:  Negative for depression. The patient is not nervous/anxious and does not have insomnia.      MEDICAL HISTORY:  Past Medical History:  Diagnosis Date   Anxiety state, unspecified    Diabetes mellitus without complication (HCC)    Esophageal reflux    First degree AV block    Hypertension    Impotence of organic origin    Osteoarthrosis, unspecified whether generalized or localized, other specified sites    Other and unspecified hyperlipidemia    Other atopic dermatitis and related conditions    Pain in joint, pelvic region and thigh    Pure hypercholesterolemia     SURGICAL HISTORY: Past Surgical History:  Procedure Laterality Date   PARTIAL HIP ARTHROPLASTY      SOCIAL HISTORY: Social History   Socioeconomic History   Marital status: Married    Spouse name: Not on file   Number of children: Not on file   Years of education: Not on file   Highest education level: Not on file  Occupational History   Not on file  Tobacco Use   Smoking status: Never   Smokeless tobacco: Never  Vaping Use   Vaping status: Never Used  Substance and Sexual Activity   Alcohol use: No   Drug use: No   Sexual activity: Yes  Other Topics Concern   Not on file  Social History Narrative   Not on file   Social Drivers of Health   Financial Resource Strain: Not on file  Food Insecurity: No Food Insecurity (04/07/2023)   Hunger Vital Sign  Worried About Programme researcher, broadcasting/film/video in the Last Year: Never true    Ran Out of Food in the Last Year: Never true  Transportation Needs: No Transportation Needs (04/07/2023)   PRAPARE - Administrator, Civil Service (Medical): No    Lack of Transportation (Non-Medical): No  Physical Activity: Not on file  Stress: Not on file  Social Connections: Not on  file  Intimate Partner Violence: Not At Risk (04/07/2023)   Humiliation, Afraid, Rape, and Kick questionnaire    Fear of Current or Ex-Partner: No    Emotionally Abused: No    Physically Abused: No    Sexually Abused: No    FAMILY HISTORY: Family History  Problem Relation Age of Onset   Diabetes Mother    Cancer Father    Liver cancer Father     ALLERGIES:  has no known allergies.  MEDICATIONS:  Current Outpatient Medications  Medication Sig Dispense Refill   aspirin 81 MG tablet Take 81 mg by mouth daily.     glipiZIDE-metformin (METAGLIP) 2.5-500 MG per tablet Take 2 tablets by mouth 2 (two) times daily before a meal.     hydrochlorothiazide (HYDRODIURIL) 25 MG tablet Take 1 tablet (25 mg total) by mouth daily. 90 tablet 3   losartan (COZAAR) 100 MG tablet Take 1 tablet (100 mg total) by mouth daily. 90 tablet 3   nebivolol (BYSTOLIC) 5 MG tablet Take 1 tablet (5 mg total) by mouth daily. 90 tablet 3   potassium chloride (KLOR-CON) 10 MEQ tablet Take 1 tablet (10 mEq total) by mouth daily. 90 tablet 3   tadalafil (CIALIS) 20 MG tablet Take 20 mg by mouth as needed.     ALPRAZolam (XANAX) 0.5 MG tablet Take 5 mg by mouth as needed. (Patient not taking: Reported on 04/07/2023)     Current Facility-Administered Medications  Medication Dose Route Frequency Provider Last Rate Last Admin   betamethasone acetate-betamethasone sodium phosphate (CELESTONE) injection 3 mg  3 mg Intramuscular Once Gala Lewandowsky M, DPM       betamethasone acetate-betamethasone sodium phosphate (CELESTONE) injection 3 mg  3 mg Intramuscular Once Gala Lewandowsky M, DPM       betamethasone acetate-betamethasone sodium phosphate (CELESTONE) injection 3 mg  3 mg Intramuscular Once Felecia Shelling, DPM         PHYSICAL EXAMINATION:   Vitals:   04/07/23 1403  BP: 127/60  Pulse: 66  Resp: 17  Temp: (!) 97 F (36.1 C)  SpO2: 99%   Filed Weights   04/07/23 1403  Weight: 177 lb 9.6 oz (80.6 kg)     Physical Exam Vitals and nursing note reviewed.  HENT:     Head: Normocephalic and atraumatic.     Mouth/Throat:     Pharynx: Oropharynx is clear.  Eyes:     Extraocular Movements: Extraocular movements intact.     Pupils: Pupils are equal, round, and reactive to light.  Cardiovascular:     Rate and Rhythm: Normal rate and regular rhythm.  Pulmonary:     Comments: Decreased breath sounds bilaterally.  Abdominal:     Palpations: Abdomen is soft.  Musculoskeletal:        General: Normal range of motion.     Cervical back: Normal range of motion.  Skin:    General: Skin is warm.  Neurological:     General: No focal deficit present.     Mental Status: He is alert and oriented to person, place, and time.  Psychiatric:        Behavior: Behavior normal.        Judgment: Judgment normal.      LABORATORY DATA:  I have reviewed the data as listed Lab Results  Component Value Date   WBC 6.6 04/07/2023   HGB 14.2 04/07/2023   HCT 45.3 04/07/2023   MCV 62.4 (L) 04/07/2023   PLT 222 04/07/2023   No results for input(s): "NA", "K", "CL", "CO2", "GLUCOSE", "BUN", "CREATININE", "CALCIUM", "GFRNONAA", "GFRAA", "PROT", "ALBUMIN", "AST", "ALT", "ALKPHOS", "BILITOT", "BILIDIR", "IBILI" in the last 8760 hours.   No results found.  ASSESSMENT & PLAN:   Microcytosis # January 25-hemoglobin 14 MCV 66 normal platelets.  Iron saturation 27; GFR 57- clinically suggestive-thalassemia especially given his Mediterranean origin.  And also family history-son also has anemia.  # No obvious evidence of iron deficiency.  I suspect as mentioned above thalassemia.  Order hemoglobinopathy evaluation for further workup and also a CBC.  # Diabetes hemoglobin A1c 7-recommend continued follow-up with PCP  # screening colonoscopy-patient never had a previous colonoscopy.  Defer to PCP.   Thank you Dr.Williams for allowing me to participate in the care of your pleasant patient. Please do not hesitate  to contact me with questions or concerns in the interim.  Johnny- call with results- [phone] # DISPOSITION: # labs today- ordered # follow up TBD- Dr.B    All questions were answered. The patient knows to call the clinic with any problems, questions or concerns.    Earna Coder, MD 04/07/2023 4:07 PM

## 2023-04-09 DIAGNOSIS — K080 Exfoliation of teeth due to systemic causes: Secondary | ICD-10-CM | POA: Diagnosis not present

## 2023-04-09 LAB — HGB FRACTIONATION CASCADE
Hgb A2: 2.2 % (ref 1.8–3.2)
Hgb A: 97.1 % (ref 96.4–98.8)
Hgb F: 0.7 % (ref 0.0–2.0)
Hgb S: 0 %

## 2023-04-14 ENCOUNTER — Other Ambulatory Visit: Payer: Self-pay | Admitting: Internal Medicine

## 2023-04-14 DIAGNOSIS — R718 Other abnormality of red blood cells: Secondary | ICD-10-CM

## 2023-04-14 NOTE — Progress Notes (Signed)
Called pt's son- had to LVM  Called pt-spoke to him about the normal results of these hemoglobin electrophoresis.  Rule out alpha thalassemia.  Patient interested.  Please call the patient with appointment for the lab-ordered

## 2023-04-21 ENCOUNTER — Inpatient Hospital Stay: Payer: Medicare Other | Attending: Internal Medicine

## 2023-04-21 DIAGNOSIS — D649 Anemia, unspecified: Secondary | ICD-10-CM | POA: Insufficient documentation

## 2023-04-21 DIAGNOSIS — R718 Other abnormality of red blood cells: Secondary | ICD-10-CM

## 2023-04-28 DIAGNOSIS — H524 Presbyopia: Secondary | ICD-10-CM | POA: Diagnosis not present

## 2023-04-28 DIAGNOSIS — E113393 Type 2 diabetes mellitus with moderate nonproliferative diabetic retinopathy without macular edema, bilateral: Secondary | ICD-10-CM | POA: Diagnosis not present

## 2023-05-02 LAB — ALPHA-THALASSEMIA GENOTYPR

## 2023-05-06 ENCOUNTER — Telehealth: Payer: Self-pay | Admitting: *Deleted

## 2023-05-06 NOTE — Telephone Encounter (Signed)
 Pt. Wants a call about the results of the labs alpha-thalassemia. Telephone is [phone]

## 2023-05-07 NOTE — Progress Notes (Signed)
 I spoke to patient regarding the results of the thalassemia workup negative for beta thalassemia and also alpha thalassemia genotyping.  However it is still possible that patient has most likely thalassemia which is undetectable based on current testing.  Since his hemoglobin is normal with MCV in the 60s-and is asymptomatic I would not recommend any further workup.  He will continue follow-up with PCP.   Will fax a copy of this note, and the alpha and beta thalassemia testing to Dr. Christiane Ha Williams/PCP office. GB

## 2023-05-08 NOTE — Telephone Encounter (Signed)
 Faxed

## 2023-06-23 DIAGNOSIS — F419 Anxiety disorder, unspecified: Secondary | ICD-10-CM | POA: Diagnosis not present

## 2023-06-23 DIAGNOSIS — E119 Type 2 diabetes mellitus without complications: Secondary | ICD-10-CM | POA: Diagnosis not present

## 2023-06-23 DIAGNOSIS — Z125 Encounter for screening for malignant neoplasm of prostate: Secondary | ICD-10-CM | POA: Diagnosis not present

## 2023-06-23 DIAGNOSIS — I10 Essential (primary) hypertension: Secondary | ICD-10-CM | POA: Diagnosis not present

## 2023-06-23 DIAGNOSIS — E7800 Pure hypercholesterolemia, unspecified: Secondary | ICD-10-CM | POA: Diagnosis not present

## 2023-07-01 ENCOUNTER — Encounter: Payer: Self-pay | Admitting: *Deleted

## 2023-08-06 DIAGNOSIS — R946 Abnormal results of thyroid function studies: Secondary | ICD-10-CM | POA: Diagnosis not present

## 2023-08-09 DIAGNOSIS — E7800 Pure hypercholesterolemia, unspecified: Secondary | ICD-10-CM | POA: Diagnosis not present

## 2023-08-09 DIAGNOSIS — I10 Essential (primary) hypertension: Secondary | ICD-10-CM | POA: Diagnosis not present

## 2023-08-09 DIAGNOSIS — E119 Type 2 diabetes mellitus without complications: Secondary | ICD-10-CM | POA: Diagnosis not present

## 2023-10-06 ENCOUNTER — Encounter: Payer: Self-pay | Admitting: Nurse Practitioner

## 2023-10-06 ENCOUNTER — Ambulatory Visit: Attending: Nurse Practitioner | Admitting: Nurse Practitioner

## 2023-10-06 VITALS — BP 130/60 | HR 62 | Resp 16 | Ht 65.0 in | Wt 172.4 lb

## 2023-10-06 DIAGNOSIS — I440 Atrioventricular block, first degree: Secondary | ICD-10-CM | POA: Diagnosis not present

## 2023-10-06 DIAGNOSIS — E785 Hyperlipidemia, unspecified: Secondary | ICD-10-CM

## 2023-10-06 DIAGNOSIS — I517 Cardiomegaly: Secondary | ICD-10-CM | POA: Diagnosis not present

## 2023-10-06 DIAGNOSIS — I493 Ventricular premature depolarization: Secondary | ICD-10-CM

## 2023-10-06 DIAGNOSIS — I10 Essential (primary) hypertension: Secondary | ICD-10-CM

## 2023-10-06 DIAGNOSIS — I5189 Other ill-defined heart diseases: Secondary | ICD-10-CM

## 2023-10-06 DIAGNOSIS — E119 Type 2 diabetes mellitus without complications: Secondary | ICD-10-CM

## 2023-10-06 MED ORDER — POTASSIUM CHLORIDE ER 10 MEQ PO TBCR: 10.0000 meq | EXTENDED_RELEASE_TABLET | Freq: Every day | ORAL | 3 refills | Status: AC

## 2023-10-06 MED ORDER — NEBIVOLOL HCL 5 MG PO TABS: 5.0000 mg | ORAL_TABLET | Freq: Every day | ORAL | 3 refills | Status: AC

## 2023-10-06 MED ORDER — LOSARTAN POTASSIUM 100 MG PO TABS: 100.0000 mg | ORAL_TABLET | Freq: Every day | ORAL | 3 refills | Status: AC

## 2023-10-06 MED ORDER — HYDROCHLOROTHIAZIDE 25 MG PO TABS: 25.0000 mg | ORAL_TABLET | Freq: Every day | ORAL | 3 refills | Status: AC

## 2023-10-06 NOTE — Progress Notes (Signed)
 Office Visit    Patient Name: Greg Livingston Date of Encounter: 10/06/2023  Primary Care Provider:  Arloa Elsie SAUNDERS, MD Primary Cardiologist:  Aleene Passe, MD (Inactive)  Chief Complaint    68 year old male with a history of atypical chest pain, LVH, first-degree AV block, PVCs, hypertension, hyperlipidemia, type 2 diabetes, and GERD who presents for follow-up related to hypertension.  Past Medical History    Past Medical History:  Diagnosis Date   Anxiety state, unspecified    Diabetes mellitus without complication (HCC)    Esophageal reflux    First degree AV block    Hypertension    Impotence of organic origin    Osteoarthrosis, unspecified whether generalized or localized, other specified sites    Other and unspecified hyperlipidemia    Other atopic dermatitis and related conditions    Pain in joint, pelvic region and thigh    Pure hypercholesterolemia    Past Surgical History:  Procedure Laterality Date   PARTIAL HIP ARTHROPLASTY      Allergies  No Known Allergies   Labs/Other Studies Reviewed    The following studies were reviewed today:  Cardiac Studies & Procedures   ______________________________________________________________________________________________   STRESS TESTS  EXERCISE TOLERANCE TEST (ETT) 03/18/2017  Interpretation Summary  Blood pressure demonstrated a hypertensive response to exercise.  There was no ST segment deviation noted during stress.  No ischemia. Hypertensive response to exertion (239/75 mmHg). Normal exercise capacity.   ECHOCARDIOGRAM  ECHOCARDIOGRAM COMPLETE 03/18/2017  Narrative *[facility]* [address] [place], [place] [address] [phone]  ------------------------------------------------------------------- Transthoracic Echocardiography  Patient:    Greg Livingston MR #:       [id number] Study Date: 03/18/2017 Gender:     M Age:        61 Height:     165.1 cm Weight:      82.3 kg BSA:        1.97 m^2 Pt. Status: Room:  REFERRING    Aleene Passe, M.D. SONOGRAPHER  Waldo Guadalajara, RCS PERFORMING   [facility], Outpatient ATTENDING    Nahser, Mickey FINES     Nahser, Jr REFERRING    Nahser, Jr  cc:  ------------------------------------------------------------------- LV EF: 55% -   60%  ------------------------------------------------------------------- Indications:      PVC&'s (I49.3).  ------------------------------------------------------------------- History:   Risk factors:  Hypertension. Diabetes mellitus.  ------------------------------------------------------------------- Study Conclusions  - Left ventricle: The cavity size was normal. There was mild concentric hypertrophy. Systolic function was normal. The estimated ejection fraction was in the range of 55% to 60%. Wall motion was normal; there were no regional wall motion abnormalities. There was an increased relative contribution of atrial contraction to ventricular filling. Doppler parameters are consistent with abnormal left ventricular relaxation (grade 1 diastolic dysfunction). - Aorta: Aortic root dimension: 39 mm (ED). - Aortic root: The aortic root was mildly dilated. - Pulmonic valve: There was trivial regurgitation. - Pulmonary arteries: Systolic pressure could not be accurately estimated.  ------------------------------------------------------------------- Study data:  No prior study was available for comparison.  Study status:  Routine.  Procedure:  The patient reported no pain pre or post test. Transthoracic echocardiography. Image quality was adequate.          Transthoracic echocardiography.  M-mode, complete 2D, spectral Doppler, and color Doppler.  Birthdate: Patient birthdate: 08/25/1955.  Age:  Patient is 68 yr old.  Sex: Gender: male.    BMI: 30.2 kg/m^2.  Blood pressure:     134/70 Patient status:  Outpatient.  Study date:  Study date: 03/18/2017. Study time: 08:13  AM.  Location:  [facility]  -------------------------------------------------------------------  ------------------------------------------------------------------- Left ventricle:  The cavity size was normal. There was mild concentric hypertrophy. Systolic function was normal. The estimated ejection fraction was in the range of 55% to 60%. Wall motion was normal; there were no regional wall motion abnormalities. There was an increased relative contribution of atrial contraction to ventricular filling. Doppler parameters are consistent with abnormal left ventricular relaxation (grade 1 diastolic dysfunction).  ------------------------------------------------------------------- Aortic valve:   Trileaflet; normal thickness leaflets. Mobility was not restricted.  Doppler:  Transvalvular velocity was within the normal range. There was no stenosis. There was no regurgitation.  ------------------------------------------------------------------- Aorta:  Aortic root: The aortic root was mildly dilated.  ------------------------------------------------------------------- Mitral valve:   Structurally normal valve.   Mobility was not restricted.  Doppler:  Transvalvular velocity was within the normal range. There was no evidence for stenosis. There was no regurgitation.    Valve area by pressure half-time: 3.1 cm^2. Indexed valve area by pressure half-time: 1.57 cm^2/m^2.    Peak gradient (D): 2 mm Hg.  ------------------------------------------------------------------- Left atrium:  The atrium was normal in size.  ------------------------------------------------------------------- Right ventricle:  The cavity size was normal. Wall thickness was normal. Systolic function was normal.  ------------------------------------------------------------------- Pulmonic valve:    Structurally normal valve.   Cusp separation was normal.  Doppler:  Transvalvular velocity was within the  normal range. There was no evidence for stenosis. There was trivial regurgitation.  ------------------------------------------------------------------- Tricuspid valve:   Structurally normal valve.    Doppler: Transvalvular velocity was within the normal range. There was no regurgitation.  ------------------------------------------------------------------- Pulmonary artery:   The main pulmonary artery was normal-sized. Systolic pressure could not be accurately estimated.  ------------------------------------------------------------------- Right atrium:  The atrium was normal in size.  ------------------------------------------------------------------- Pericardium:  There was no pericardial effusion.  ------------------------------------------------------------------- Systemic veins: Inferior vena cava: The vessel was normal in size. The respirophasic diameter changes were in the normal range (= 50%), consistent with normal central venous pressure. Diameter: 13 mm.  ------------------------------------------------------------------- Measurements  IVC                                      Value          Reference ID                                       13    mm       ----------  Left ventricle                           Value          Reference LV ID, ED, PLAX chordal                  47    mm       43 - 52 LV ID, ES, PLAX chordal                  29    mm       23 - 38 LV fx shortening, PLAX chordal           38    %        >=29 LV  PW thickness, ED                      14    mm       ---------- IVS/LV PW ratio, ED                      0.79           <=1.3 Stroke volume, 2D                        66    ml       ---------- Stroke volume/bsa, 2D                    34    ml/m^2   ---------- LV e&', lateral                           5.77  cm/s     ---------- LV E/e&', lateral                         12.62          ---------- LV e&', medial                            5.77  cm/s      ---------- LV E/e&', medial                          12.62          ---------- LV e&', average                           5.77  cm/s     ---------- LV E/e&', average                         12.62          ----------  Ventricular septum                       Value          Reference IVS thickness, ED                        11    mm       ----------  LVOT                                     Value          Reference LVOT ID, S                               21    mm       ---------- LVOT area                                3.46  cm^2     ---------- LVOT peak velocity, S  85    cm/s     ---------- LVOT mean velocity, S                    54.1  cm/s     ---------- LVOT VTI, S                              19    cm       ----------  Aorta                                    Value          Reference Aortic root ID, ED                       39    mm       ----------  Left atrium                              Value          Reference LA ID, A-P, ES                           32    mm       ---------- LA ID/bsa, A-P                           1.63  cm/m^2   <=2.2 LA volume, S                             49.6  ml       ---------- LA volume/bsa, S                         25.2  ml/m^2   ---------- LA volume, ES, 1-p A4C                   43.1  ml       ---------- LA volume/bsa, ES, 1-p A4C               21.9  ml/m^2   ---------- LA volume, ES, 1-p A2C                   51.5  ml       ---------- LA volume/bsa, ES, 1-p A2C               26.2  ml/m^2   ----------  Mitral valve                             Value          Reference Mitral E-wave peak velocity              72.8  cm/s     ---------- Mitral A-wave peak velocity              86.8  cm/s     ---------- Mitral deceleration time       (H)       243   ms       150 -  230 Mitral pressure half-time                71    ms       ---------- Mitral peak gradient, D                  2     mm Hg    ---------- Mitral E/A ratio, peak                    0.8            ---------- Mitral valve area, PHT, DP               3.1   cm^2     ---------- Mitral valve area/bsa, PHT, DP           1.57  cm^2/m^2 ----------  Right atrium                             Value          Reference RA ID, S-I, ES, A4C            (H)       50.5  mm       34 - 49 RA area, ES, A4C                         11.2  cm^2     8.3 - 19.5 RA volume, ES, A/L                       21.2  ml       ---------- RA volume/bsa, ES, A/L                   10.8  ml/m^2   ----------  Right ventricle                          Value          Reference TAPSE                                    23.3  mm       ---------- RV s&', lateral, S                        13.6  cm/s     ----------  Legend: (L)  and  (H)  mark values outside specified reference range.  ------------------------------------------------------------------- Prepared and Electronically Authenticated by  Wilbert Bihari, MD 2019-01-08T09:52:12          ______________________________________________________________________________________________     Recent Labs: 04/07/2023: Hemoglobin 14.2; Platelets 222  Recent Lipid Panel    Component Value Date/Time   CHOL 112 05/09/2016 1230   TRIG 95 05/09/2016 1230   HDL 38 (L) 05/09/2016 1230   CHOLHDL 2.9 05/09/2016 1230   LDLCALC 55 05/09/2016 1230    History of Present Illness    68 year old male with the above past medical history including atypical chest pain, LVH, first-degree AV block, PVCs, hypertension, hyperlipidemia, type 2 diabetes, and GERD.  He has a history of atypical chest pain, diastolic dysfunction noted on prior echocardiogram in [place], [place].  Echocardiogram in 2019 showed EF 55 to 60%, mild concentric LVH, normal LV systolic  function, G1 DD, no significant valvular abnormalities, mild dilation of the aortic root measuring 39 mm.  ETT in 2019 was negative for ischemia.  He was last seen in the office on 10/01/2022 and was stable from a  cardiac standpoint.  He denies symptoms concerning for angina.  BP was stable.  He presents today for follow-up.  Since his last visit has done well from a cardiac standpoint.  He denies any symptoms concerning for angina.  He has noted occasional elevated BP readings, this has been inconsistent.  Overall, he reports feeling well.  Home Medications    Current Outpatient Medications  Medication Sig Dispense Refill   ALPRAZolam (XANAX) 0.5 MG tablet Take 5 mg by mouth as needed.     aspirin 81 MG tablet Take 81 mg by mouth daily.     glipiZIDE-metformin (METAGLIP) 2.5-500 MG per tablet Take 2 tablets by mouth 2 (two) times daily before a meal.     tadalafil (CIALIS) 20 MG tablet Take 20 mg by mouth as needed.     hydrochlorothiazide  (HYDRODIURIL ) 25 MG tablet Take 1 tablet (25 mg total) by mouth daily. 90 tablet 3   losartan  (COZAAR ) 100 MG tablet Take 1 tablet (100 mg total) by mouth daily. 90 tablet 3   nebivolol  (BYSTOLIC ) 5 MG tablet Take 1 tablet (5 mg total) by mouth daily. 90 tablet 3   potassium chloride  (KLOR-CON ) 10 MEQ tablet Take 1 tablet (10 mEq total) by mouth daily. 90 tablet 3   Current Facility-Administered Medications  Medication Dose Route Frequency Provider Last Rate Last Admin   betamethasone  acetate-betamethasone  sodium phosphate (CELESTONE ) injection 3 mg  3 mg Intramuscular Once Evans, Brent M, DPM       betamethasone  acetate-betamethasone  sodium phosphate (CELESTONE ) injection 3 mg  3 mg Intramuscular Once Evans, Brent M, DPM       betamethasone  acetate-betamethasone  sodium phosphate (CELESTONE ) injection 3 mg  3 mg Intramuscular Once Janit Thresa HERO, DPM         Review of Systems    He denies chest pain, palpitations, dyspnea, pnd, orthopnea, n, v, dizziness, syncope, edema, weight gain, or early satiety. All other systems reviewed and are otherwise negative except as noted above.   Physical Exam    VS:  BP 130/60   Pulse 62   Resp 16   Ht 5' 5 (1.651 m)   Wt  172 lb 6.4 oz (78.2 kg)   SpO2 97%   BMI 28.69 kg/m  GEN: Well nourished, well developed, in no acute distress. HEENT: normal. Neck: Supple, no JVD, carotid bruits, or masses. Cardiac: RRR, no murmurs, rubs, or gallops. No clubbing, cyanosis, edema.  Radials/DP/PT 2+ and equal bilaterally.  Respiratory:  Respirations regular and unlabored, clear to auscultation bilaterally. GI: Soft, nontender, nondistended, BS + x 4. MS: no deformity or atrophy. Skin: warm and dry, no rash. Neuro:  Strength and sensation are intact. Psych: Normal affect.  Accessory Clinical Findings    ECG personally reviewed by me today - EKG Interpretation Date/Time:  Monday October 06 2023 11:12:43 EDT Ventricular Rate:  63 PR Interval:  280 QRS Duration:  98 QT Interval:  400 QTC Calculation: 409 R Axis:   -45  Text Interpretation: Sinus rhythm with 1st degree A-V block Left axis deviation Minimal voltage criteria for LVH, may be normal variant ( [employer] product ) When compared with ECG of 01-Oct-2022 14:19, QRS axis Shifted left Confirmed by Daneen Perkins ([id number]) on 10/06/2023 11:51:02 AM  - no acute  changes.   Lab Results  Component Value Date   WBC 6.6 04/07/2023   HGB 14.2 04/07/2023   HCT 45.3 04/07/2023   MCV 62.4 (L) 04/07/2023   PLT 222 04/07/2023   Lab Results  Component Value Date   CREATININE 1.13 04/21/2017   BUN 12 04/21/2017   NA 138 04/21/2017   K 4.0 04/21/2017   CL 98 04/21/2017   CO2 26 04/21/2017   Lab Results  Component Value Date   ALT 30 05/09/2016   AST 26 05/09/2016   ALKPHOS 50 05/09/2016   BILITOT 1.2 05/09/2016   Lab Results  Component Value Date   CHOL 112 05/09/2016   HDL 38 (L) 05/09/2016   LDLCALC 55 05/09/2016   TRIG 95 05/09/2016   CHOLHDL 2.9 05/09/2016    No results found for: HGBA1C  Assessment & Plan   1. Atypical chest pain: ETT in 2018 was negative for ischemia.  Most recent echo as below.  Stable with no anginal symptoms, no indication for  ischemic evaluation.   2. LVH/G1DD:  Echocardiogram in 2019 showed EF 55 to 60%, mild concentric LVH, normal LV systolic function, G1 DD, no significant valvular abnormalities, mild dilation of the aortic root measuring 39 mm. Euvolemic and well compensated on exam.  Continue losartan , Nebivolol , hydrochlorothiazide .  3. PVCs: Denies any recent palpitations.  Continue Nebivolol .  4. Hypertension: BP initially elevated in office today, improved with recheck.  Continue to monitor BP and report BP consistently greater than 130/80.  For now, continue current antihypertensive regimen.  5. Hyperlipidemia: LDL was 26 in 06/2023.  Not on statin therapy.  Continue lifestyle modifications with diet and exercise.  6. Type 2 diabetes: A1c was 7.7 in 06/2023.  Monitoring and managed per PCP.  7. Disposition: Follow-up in 1 year, sooner if needed.  Former patient of Dr. Alveta, he will notify us  when he decides which cardiologist he wishes to establish with going forward.      Damien JAYSON Braver, NP 10/06/2023, 12:21 PM

## 2023-10-06 NOTE — Patient Instructions (Signed)
 Medication Instructions:  Your physician recommends that you continue on your current medications as directed. Please refer to the Current Medication list given to you today.  *If you need a refill on your cardiac medications before your next appointment, please call your pharmacy*  Lab Work: NONE ordered at this time of appointment   Testing/Procedures: NONE ordered at this time of appointment   Follow-Up: At [facility], you and your health needs are our priority.  As part of our continuing mission to provide you with exceptional heart care, our providers are all part of one team.  This team includes your primary Cardiologist (physician) and Advanced Practice Providers or APPs (Physician Assistants and Nurse Practitioners) who all work together to provide you with the care you need, when you need it.  Your next appointment:   1 year(s)  Provider:   Marlana Silvan, NP          We recommend signing up for the patient portal called MyChart.  Sign up information is provided on this After Visit Summary.  MyChart is used to connect with patients for Virtual Visits (Telemedicine).  Patients are able to view lab/test results, encounter notes, upcoming appointments, etc.  Non-urgent messages can be sent to your provider as well.   To learn more about what you can do with MyChart, go to [email or web address].

## 2023-10-27 DIAGNOSIS — E113393 Type 2 diabetes mellitus with moderate nonproliferative diabetic retinopathy without macular edema, bilateral: Secondary | ICD-10-CM | POA: Diagnosis not present

## 2023-11-09 DIAGNOSIS — E7800 Pure hypercholesterolemia, unspecified: Secondary | ICD-10-CM | POA: Diagnosis not present

## 2023-11-09 DIAGNOSIS — I10 Essential (primary) hypertension: Secondary | ICD-10-CM | POA: Diagnosis not present

## 2023-11-09 DIAGNOSIS — E119 Type 2 diabetes mellitus without complications: Secondary | ICD-10-CM | POA: Diagnosis not present

## 2023-11-12 DIAGNOSIS — K080 Exfoliation of teeth due to systemic causes: Secondary | ICD-10-CM | POA: Diagnosis not present

## 2023-12-09 DIAGNOSIS — I10 Essential (primary) hypertension: Secondary | ICD-10-CM | POA: Diagnosis not present

## 2023-12-09 DIAGNOSIS — E7800 Pure hypercholesterolemia, unspecified: Secondary | ICD-10-CM | POA: Diagnosis not present

## 2023-12-09 DIAGNOSIS — E119 Type 2 diabetes mellitus without complications: Secondary | ICD-10-CM | POA: Diagnosis not present

## 2024-01-09 DIAGNOSIS — E7800 Pure hypercholesterolemia, unspecified: Secondary | ICD-10-CM | POA: Diagnosis not present

## 2024-01-09 DIAGNOSIS — E119 Type 2 diabetes mellitus without complications: Secondary | ICD-10-CM | POA: Diagnosis not present

## 2024-01-09 DIAGNOSIS — I10 Essential (primary) hypertension: Secondary | ICD-10-CM | POA: Diagnosis not present

## 2024-01-12 DIAGNOSIS — F419 Anxiety disorder, unspecified: Secondary | ICD-10-CM | POA: Diagnosis not present

## 2024-01-12 DIAGNOSIS — I10 Essential (primary) hypertension: Secondary | ICD-10-CM | POA: Diagnosis not present

## 2024-01-12 DIAGNOSIS — E7800 Pure hypercholesterolemia, unspecified: Secondary | ICD-10-CM | POA: Diagnosis not present

## 2024-01-12 DIAGNOSIS — E119 Type 2 diabetes mellitus without complications: Secondary | ICD-10-CM | POA: Diagnosis not present

## 2024-01-12 DIAGNOSIS — Z0000 Encounter for general adult medical examination without abnormal findings: Secondary | ICD-10-CM | POA: Diagnosis not present

## 2024-01-12 DIAGNOSIS — R946 Abnormal results of thyroid function studies: Secondary | ICD-10-CM | POA: Diagnosis not present

## 2024-01-12 DIAGNOSIS — N1831 Chronic kidney disease, stage 3a: Secondary | ICD-10-CM | POA: Diagnosis not present

## 2024-02-08 DIAGNOSIS — I10 Essential (primary) hypertension: Secondary | ICD-10-CM | POA: Diagnosis not present

## 2024-02-08 DIAGNOSIS — E7800 Pure hypercholesterolemia, unspecified: Secondary | ICD-10-CM | POA: Diagnosis not present

## 2024-02-08 DIAGNOSIS — E119 Type 2 diabetes mellitus without complications: Secondary | ICD-10-CM | POA: Diagnosis not present
